# Patient Record
Sex: Female | Born: 1940 | Race: White | Hispanic: No | Marital: Married | Smoking: Never smoker
Health system: Southern US, Community
[De-identification: ages and names within clinical notes are randomized; demographics above are authoritative.]

## PROBLEM LIST (undated history)

## (undated) DIAGNOSIS — E039 Hypothyroidism, unspecified: Secondary | ICD-10-CM

## (undated) DIAGNOSIS — Z9889 Other specified postprocedural states: Secondary | ICD-10-CM

## (undated) DIAGNOSIS — G8929 Other chronic pain: Secondary | ICD-10-CM

## (undated) DIAGNOSIS — I10 Essential (primary) hypertension: Secondary | ICD-10-CM

## (undated) DIAGNOSIS — K589 Irritable bowel syndrome without diarrhea: Secondary | ICD-10-CM

## (undated) DIAGNOSIS — R9439 Abnormal result of other cardiovascular function study: Secondary | ICD-10-CM

## (undated) DIAGNOSIS — K219 Gastro-esophageal reflux disease without esophagitis: Secondary | ICD-10-CM

## (undated) DIAGNOSIS — F419 Anxiety disorder, unspecified: Secondary | ICD-10-CM

## (undated) DIAGNOSIS — G971 Other reaction to spinal and lumbar puncture: Secondary | ICD-10-CM

## (undated) DIAGNOSIS — M542 Cervicalgia: Secondary | ICD-10-CM

## (undated) DIAGNOSIS — R112 Nausea with vomiting, unspecified: Secondary | ICD-10-CM

## (undated) DIAGNOSIS — N390 Urinary tract infection, site not specified: Secondary | ICD-10-CM

## (undated) DIAGNOSIS — M1990 Unspecified osteoarthritis, unspecified site: Secondary | ICD-10-CM

## (undated) HISTORY — PX: APPENDECTOMY: SHX54

## (undated) HISTORY — PX: TONSILLECTOMY: SUR1361

## (undated) HISTORY — PX: RECTOCELE REPAIR: SHX761

## (undated) HISTORY — PX: OTHER SURGICAL HISTORY: SHX169

## (undated) HISTORY — PX: DILATION AND CURETTAGE OF UTERUS: SHX78

---

## 1983-07-11 HISTORY — PX: ABDOMINAL HYSTERECTOMY: SHX81

## 2001-07-12 ENCOUNTER — Ambulatory Visit (HOSPITAL_COMMUNITY): Admission: RE | Admit: 2001-07-12 | Discharge: 2001-07-12 | Payer: Self-pay | Admitting: Obstetrics and Gynecology

## 2001-07-12 ENCOUNTER — Encounter: Payer: Self-pay | Admitting: Obstetrics and Gynecology

## 2002-09-19 ENCOUNTER — Encounter: Payer: Self-pay | Admitting: Family Medicine

## 2002-09-19 ENCOUNTER — Ambulatory Visit (HOSPITAL_COMMUNITY): Admission: RE | Admit: 2002-09-19 | Discharge: 2002-09-19 | Payer: Self-pay | Admitting: Family Medicine

## 2003-03-09 ENCOUNTER — Encounter: Payer: Self-pay | Admitting: Internal Medicine

## 2003-03-09 ENCOUNTER — Ambulatory Visit (HOSPITAL_COMMUNITY): Admission: RE | Admit: 2003-03-09 | Discharge: 2003-03-09 | Payer: Self-pay | Admitting: Internal Medicine

## 2003-07-08 ENCOUNTER — Ambulatory Visit (HOSPITAL_COMMUNITY): Admission: RE | Admit: 2003-07-08 | Discharge: 2003-07-08 | Payer: Self-pay | Admitting: Internal Medicine

## 2003-09-29 ENCOUNTER — Ambulatory Visit (HOSPITAL_COMMUNITY): Admission: RE | Admit: 2003-09-29 | Discharge: 2003-09-29 | Payer: Self-pay | Admitting: Internal Medicine

## 2003-10-08 ENCOUNTER — Other Ambulatory Visit: Admission: RE | Admit: 2003-10-08 | Discharge: 2003-10-08 | Payer: Self-pay | Admitting: Dermatology

## 2003-12-25 ENCOUNTER — Ambulatory Visit (HOSPITAL_COMMUNITY): Admission: RE | Admit: 2003-12-25 | Discharge: 2003-12-25 | Payer: Self-pay | Admitting: Internal Medicine

## 2003-12-26 HISTORY — PX: COLONOSCOPY: SHX174

## 2004-03-04 ENCOUNTER — Ambulatory Visit (HOSPITAL_COMMUNITY): Admission: RE | Admit: 2004-03-04 | Discharge: 2004-03-04 | Payer: Self-pay | Admitting: Internal Medicine

## 2004-03-04 HISTORY — PX: COLONOSCOPY: SHX174

## 2004-08-15 ENCOUNTER — Ambulatory Visit (HOSPITAL_COMMUNITY): Admission: RE | Admit: 2004-08-15 | Discharge: 2004-08-15 | Payer: Self-pay | Admitting: Internal Medicine

## 2004-10-04 ENCOUNTER — Ambulatory Visit (HOSPITAL_COMMUNITY): Admission: RE | Admit: 2004-10-04 | Discharge: 2004-10-04 | Payer: Self-pay | Admitting: Obstetrics and Gynecology

## 2005-10-05 ENCOUNTER — Ambulatory Visit (HOSPITAL_COMMUNITY): Admission: RE | Admit: 2005-10-05 | Discharge: 2005-10-05 | Payer: Self-pay | Admitting: Internal Medicine

## 2005-11-29 ENCOUNTER — Ambulatory Visit: Payer: Self-pay | Admitting: Internal Medicine

## 2005-12-06 ENCOUNTER — Ambulatory Visit (HOSPITAL_COMMUNITY): Admission: RE | Admit: 2005-12-06 | Discharge: 2005-12-06 | Payer: Self-pay | Admitting: Internal Medicine

## 2006-10-08 ENCOUNTER — Ambulatory Visit (HOSPITAL_COMMUNITY): Admission: RE | Admit: 2006-10-08 | Discharge: 2006-10-08 | Payer: Self-pay | Admitting: Internal Medicine

## 2007-07-10 ENCOUNTER — Ambulatory Visit (HOSPITAL_COMMUNITY): Admission: RE | Admit: 2007-07-10 | Discharge: 2007-07-10 | Payer: Self-pay | Admitting: Internal Medicine

## 2007-07-11 DIAGNOSIS — R9439 Abnormal result of other cardiovascular function study: Secondary | ICD-10-CM

## 2007-07-11 HISTORY — DX: Abnormal result of other cardiovascular function study: R94.39

## 2007-07-11 HISTORY — PX: CHOLECYSTECTOMY: SHX55

## 2007-08-08 ENCOUNTER — Ambulatory Visit: Payer: Self-pay | Admitting: Internal Medicine

## 2007-08-16 ENCOUNTER — Ambulatory Visit (HOSPITAL_COMMUNITY): Admission: RE | Admit: 2007-08-16 | Discharge: 2007-08-16 | Payer: Self-pay | Admitting: Internal Medicine

## 2007-08-16 ENCOUNTER — Ambulatory Visit: Payer: Self-pay | Admitting: Internal Medicine

## 2007-08-16 HISTORY — PX: ESOPHAGOGASTRODUODENOSCOPY: SHX1529

## 2007-08-21 ENCOUNTER — Encounter (HOSPITAL_COMMUNITY): Admission: RE | Admit: 2007-08-21 | Discharge: 2007-09-20 | Payer: Self-pay | Admitting: Internal Medicine

## 2007-09-02 ENCOUNTER — Ambulatory Visit (HOSPITAL_COMMUNITY): Admission: RE | Admit: 2007-09-02 | Discharge: 2007-09-02 | Payer: Self-pay | Admitting: General Surgery

## 2007-09-02 ENCOUNTER — Encounter (INDEPENDENT_AMBULATORY_CARE_PROVIDER_SITE_OTHER): Payer: Self-pay | Admitting: General Surgery

## 2007-10-02 ENCOUNTER — Ambulatory Visit: Payer: Self-pay | Admitting: Internal Medicine

## 2007-10-30 ENCOUNTER — Ambulatory Visit (HOSPITAL_COMMUNITY): Admission: RE | Admit: 2007-10-30 | Discharge: 2007-10-30 | Payer: Self-pay | Admitting: Internal Medicine

## 2007-11-28 ENCOUNTER — Ambulatory Visit (HOSPITAL_COMMUNITY): Admission: RE | Admit: 2007-11-28 | Discharge: 2007-11-28 | Payer: Self-pay | Admitting: Internal Medicine

## 2008-09-21 ENCOUNTER — Ambulatory Visit (HOSPITAL_COMMUNITY): Admission: RE | Admit: 2008-09-21 | Discharge: 2008-09-21 | Payer: Self-pay | Admitting: Internal Medicine

## 2008-11-17 ENCOUNTER — Ambulatory Visit (HOSPITAL_COMMUNITY): Admission: RE | Admit: 2008-11-17 | Discharge: 2008-11-17 | Payer: Self-pay | Admitting: Internal Medicine

## 2009-11-22 ENCOUNTER — Ambulatory Visit (HOSPITAL_COMMUNITY): Admission: RE | Admit: 2009-11-22 | Discharge: 2009-11-22 | Payer: Self-pay | Admitting: Internal Medicine

## 2010-08-01 ENCOUNTER — Encounter: Payer: Self-pay | Admitting: Internal Medicine

## 2010-11-22 NOTE — Op Note (Signed)
NAMEAVANTI, Misty [name]            ACCOUNT NO.:  [id number]   MEDICAL RECORD NO.:  [id number]          PATIENT TYPE:  AMB   LOCATION:  DAY                           FACILITY:  [facility]   PHYSICIAN:  Dalia Heading, M.D.  DATE OF BIRTH:  [birth year]   DATE OF PROCEDURE:  09/02/2007  DATE OF DISCHARGE:                               OPERATIVE REPORT   PREOPERATIVE DIAGNOSIS:  Chronic cholecystitis.   POSTOPERATIVE DIAGNOSIS:  Chronic cholecystitis.   PROCEDURE:  Laparoscopic cholecystectomy.   SURGEON:  Dr. Franky Macho.   ANESTHESIA:  General endotracheal.   INDICATIONS:  The patient is a 70 year old white female who presents  with biliary colic secondary to chronic cholecystitis.  The risks and  benefits of the procedure including bleeding, infection, hepatobiliary  injury, the possibly an open procedure were fully explained to the  patient, who gave informed consent.   PROCEDURE NOTE:  The patient was placed in supine position.  After  induction of general endotracheal anesthesia, the abdomen was prepped  and draped in the usual sterile technique with Betadine.  Surgical site  confirmation was performed.   An infraumbilical incision was made down to fascia.  A Veress needle was  introduced into the abdominal cavity and confirmation of placement was  done using the saline drop test.  The abdomen was then insufflated to 16  mmHg pressure.  An 11 mm trocar was introduced into the abdominal cavity  under direct visualization.  There was some omentum adherent to the  underside of the abdominal wall.  Camera was then advanced into the  right upper quadrant under direct visualization without difficulty.  The  bowel was inspected and there was no evidence of injury to the bowel.  The patient was placed in reversed Trendelenburg position.  An  additional 11-mm trocar was placed in the epigastric region and 5-mm  trocars were placed in the right upper quadrant and right flank regions.  Liver was inspected and noted to be within normal limits.  The  gallbladder was retracted superior and laterally.  The dissection was  begun around the infundibulum of the gallbladder.  Cystic duct was first  identified.  Its juncture to the infundibulum fully identified.  Endo  clips were placed proximally and distally on the cystic duct and cystic  duct was divided.  This was likewise done to cystic artery.  The  gallbladder then freed away from the gallbladder fossa using Bovie  electrocautery.  The gallbladder delivered through the epigastric trocar  site using EndoCatch bag.  The gallbladder fossa was inspected and no  abnormal bleeding or bile leakage was noted.  Surgicel was placed in the  gallbladder fossa.  All fluid and air were then evacuated from the  abdominal cavity prior to removal of the trocars.   All wounds were irrigated normal saline.  All wounds were injected with  0.5 cm Sensorcaine.  The infraumbilical fascia was reapproximated using  a 0-0 Vicryl interrupted suture.  All skin incisions were closed using  staples.  Betadine ointment and dry sterile dressings were applied.   All tape and needle counts were  correct at the end of the procedure.  The patient was extubated in the operating room and went back to  recovery room awake and in stable condition.   COMPLICATIONS:  None.   SPECIMEN:  Gallbladder.   BLOOD LOSS:  Minimal.      Dalia Heading, M.D.  Electronically Signed     MAJ/MEDQ  D:  09/02/2007  T:  09/02/2007  Job:  [id number]   cc:   Kingsley Callander. Ouida Sills, MD  Fax: [phone]

## 2010-11-22 NOTE — H&P (Signed)
Misty [name], Misty [name]            ACCOUNT NO.:  [id number]   MEDICAL RECORD NO.:  [id number]          PATIENT TYPE:  AMB   LOCATION:  DAY                           FACILITY:  [facility]   PHYSICIAN:  Dalia Heading, M.D.  DATE OF BIRTH:  [birth year]   DATE OF ADMISSION:  09/02/2007  DATE OF DISCHARGE:  LH                              HISTORY & PHYSICAL   CHIEF COMPLAINT:  Chronic cholecystitis.   HISTORY OF PRESENT ILLNESS:  The patient is a 70 year old white female  who is referred for evaluation and treatment of biliary colic secondary  to chronic cholecystitis.  She has been having right upper quadrant  abdominal pain with radiation to the flank, nausea and indigestion for  many weeks.  It is made worse with fatty food.  No fever, chills or  jaundice have been noted.   PAST MEDICAL HISTORY:  1. Hypothyroidism.  2. Reflux disease.   PAST SURGICAL HISTORY:  Appendectomy, hysterectomy, tonsillectomy.   CURRENT MEDICATIONS:  Synthroid, Estrace, Protonix, lorazepam.   ALLERGIES:  ASPIRIN.   REVIEW OF SYSTEMS:  Noncontributory.   PHYSICAL EXAMINATION:  The patient is a well-developed, well-nourished  female in no acute distress.  HEENT:  No scleral icterus.  LUNGS:  Clear to auscultation with equal breath sounds bilaterally.  CARDIAC:  A regular rate and rhythm without S3, S4 or murmurs.  ABDOMEN:  Soft, nontender, nondistended.  No hepatosplenomegaly, masses  or hernias are identified.   Ultrasound of the gallbladder is negative.  HIDA scan reveals chronic  cholecystitis with a low gallbladder ejection fraction and reproducible  symptoms with a fatty meal.   IMPRESSION:  Chronic cholecystitis.   PLAN:  The patient is scheduled for a laparoscopic cholecystectomy on  September 02, 2007.  The risks and benefits of the procedure including  bleeding, infection, hepatobiliary injury, and the possibility of an  open procedure were fully explained to the patient, who gave informed  consent.      Dalia Heading, M.D.  Electronically Signed     MAJ/MEDQ  D:  08/29/2007  T:  08/30/2007  Job:  [id number]   cc:   [facility]  Fax: [phone]   Kingsley Callander. Ouida Sills, MD  Fax: [phone]

## 2010-11-22 NOTE — Procedures (Signed)
NAMEARACELI, Misty [name] NO.:  [id number]   MEDICAL RECORD NO.:  [id number]          PATIENT TYPE:  OUT   LOCATION:                                FACILITY:  [facility]   PHYSICIAN:  Kingsley Callander. Ouida Sills, MD       DATE OF BIRTH:  1941/05/06   DATE OF PROCEDURE:  DATE OF DISCHARGE:                                  STRESS TEST   EXERCISE STRESS TEST   The patient exercised 8 minutes (2 minutes in stage III of the Bruce  protocol) attaining a maximal heart rate of 166 (108% of the age  predicted maximal heart rate) at a workload of 10.1 METs and  discontinued exercise due to fatigue.  There were no symptoms of chest  pain.  There were 2 PVCs during recovery, but otherwise no rhythm  changes.  There was a brisk cardioaccelerator response consistent with  possible exercise deconditioning.  The baseline electrocardiogram  revealed normal sinus rhythm with nonspecific inferolateral ST-T wave  changes.  These changes were present throughout the study and in  recovery.  No significant change is noted.   IMPRESSION:  No definite evidence of exercise-induced ischemia.  Baseline inferolateral ST-T wave changes do somewhat limit  interpretation of the study.  This was discussed with the patient.  Based on her risk factor profile and lack of symptoms, we will not  proceed with a Myoview study at this time.      Kingsley Callander. Ouida Sills, MD  Electronically Signed     ROF/MEDQ  D:  11/28/2007  T:  11/28/2007  Job:  [id number]

## 2010-11-22 NOTE — Consult Note (Signed)
NAME:  Misty [name], Misty [name]            ACCOUNT NO.:  [id number]   MEDICAL RECORD NO.:  [id number]          PATIENT TYPE:  AMB   LOCATION:  DAY                           FACILITY:  [facility]   PHYSICIAN:  R. Roetta Sessions, M.D. DATE OF BIRTH:  December 14, 1940   DATE OF CONSULTATION:  08/08/2007  DATE OF DISCHARGE:                                 CONSULTATION   REFERRING PHYSICIAN:  Kingsley Callander. Ouida Sills, M.D.   CHIEF COMPLAINT/REASON FOR REFERRAL:  Refractory acid reflux.   HISTORY OF PRESENT ILLNESS:  Ms. Misty Wamser. [name] is a pleasant, 10-  year-old lady referred over to me by Dr. Carylon Perches to further evaluate a  60-month history of apparent recurrent reflux symptoms.  Ms. Misty [name]  describes intermittent postprandial boring epigastric pain that radiates  into her back.  She has occasional hot sour liquid coming up in to her  esophagus, but not always.  She had been on acid suppression therapy in  the past.  She contacted Dr. Ouida Sills in November or December 2008, and she  has been on Protonix, Nexium, and more recently Prevacid 30 grams orally  on a b.i.d. regimen, and all of her regurgitation/heartburn component of  her symptoms have improved.  She still has intermittent boring  epigastric pain which almost always occurs postprandially.   In addition, she was taking quite a bit of Motrin in the fall for  various aches and pain.  She has had not had any melena or rectal  bleeding, no odynophagia or dysphagia.  She has actually lost 2-1/2  pounds since when I last saw this nice lady in May 2007.  She had  previously undergone an EGD by Dr. Elder Cyphers in 2004.  By her report, it  indicated some reflux esophagitis.  There was no report of Barrett's  esophagus.  She had a colonoscopy done by me in 2005 which revealed  NSAID-related colonic ulceration, and follow-up colonoscopy demonstrated  complete healing of these lesions.  Ms. Misty [name] does not consume  alcohol.  She does not use tobacco products.  Although  I do not have a  report, Ms. Misty [name] states that Dr. Ouida Sills obtained a gallbladder  ultrasound which was reported as negative.   PAST MEDICAL HISTORY:  Noted for osteoarthritis and gastroesophageal  reflux disease.   PAST SURGERIES:  Two surgeries:  Appendectomy and hysterectomy.   CURRENT MEDICATIONS:  1. Synthroid 100 mcg daily alternating with 112 mcg every over day.  2. Estradiol half a 1 mg tablet daily.  3. Lorazepam 5 mg at bedtime p.r.n.  4. Ecotrin 81 mg twice weekly.  5. Prevacid 30 mg orally b.i.d.   ALLERGIES:  ASPIRIN.   FAMILY HISTORY:  Mother has reflux.  Father died with lung cancer.  One  sister with reflux.  No history of chronic GI or liver disease  otherwise.   SOCIAL HISTORY:  The patient is married, has 2 children. She is retired  from the [employer].  No tobacco, no alcohol.   REVIEW OF SYSTEMS:  No chest pain, dyspnea on exertion.  Minimal weight  loss in the past 2  years.  No fever, chills.   PHYSICAL EXAMINATION:  GENERAL:  A pleasant, 66-year lady resting  comfortably.  Weight 145, height 5 feet 3 inches.  VITAL SIGNS:  Temperature 97.8, BP 120/76, pulse 76.  SKIN:  Warm and dry.  HEENT EXAM:  No scleral icterus.  Conjunctivae are pink.  Oral cavity:  No lesions.  CHEST:  Lungs are clear to auscultation.  CARDIAC EXAM:  Regular rate and rhythm without murmur, gallop, or rub.  ABDOMEN:  Nondistended.  Positive bowel sounds.  She does have  epigastric tenderness to deep palpation.  No appreciable mass or  hepatosplenomegaly.  EXTREMITY EXAM:  No edema.   IMPRESSION:  Ms. Misty [name] is a very pleasant, 70 year old lady  with a good 3-4 month history of what she describes as postprandial  retroxiphoid pain radiating into her back.  She does have baseline  typical reflux symptoms which have been pretty much well squelched with  b.i.d. Prevacid;  however, the postprandial retroxiphoid pain that  radiates into  her back  intermittently has not really been significantly  improved with even an aggressive acid suppression regimen.  Even though  her gallbladder ultrasound was negative, I would be concerned about the  possibility of underlying occult gallbladder disease.  She is 3, and it  is bothersome that she is having a crescendo symptoms refractory to acid  suppression therapy.  She has been on Motrin recently as well.   RECOMMENDATIONS:  I told Ms. Misty [name] we ought to go ahead and just look  at her upper GI tract.  It has not been surveyed in a good 5 years.  EGD  is the best approach.  The risks, benefits, and alternatives have been  reviewed.  Her questions were answered.  She is agreeable.  If upper  endoscopy is unrevealing, I would then proceed with a HIDA with fatty  meal or CCK challenge.  Further recommendations to follow in the very  near future.   I would like to thank Dr. Carylon Perches for letting me see this nice lady  once again in consultation.      Jonathon Bellows, M.D.  Electronically Signed     RMR/MEDQ  D:  08/08/2007  T:  08/09/2007  Job:  [id number]   cc:   Kingsley Callander. Ouida Sills, MD  Fax: [phone]

## 2010-11-22 NOTE — Op Note (Signed)
NAME:  Misty [name], Misty [name]            ACCOUNT NO.:  [id number]   MEDICAL RECORD NO.:  [id number]          PATIENT TYPE:  AMB   LOCATION:  DAY                           FACILITY:  [facility]   PHYSICIAN:  R. Roetta Sessions, M.D. DATE OF BIRTH:  1940-08-20   DATE OF PROCEDURE:  08/16/2007  DATE OF DISCHARGE:                               OPERATIVE REPORT   PROCEDURE:  Diagnostic esophagogastroduodenoscopy.   INDICATIONS FOR PROCEDURE:  A 70 year old lady with a 75-month history of  postprandial retroxiphoid discomfort radiating into her back.  This year  her baseline typical reflux symptoms have been pretty well-squelched  with Prevacid once daily.  More recently she had been taking Prevacid 30  mg orally twice daily.  EGD now being done.  We saw her on August 08, 2007.  On her own she dropped back to Prevacid 30 mg once daily and  states that the above-mentioned symptoms have almost resolved.  Gallbladder ultrasound came back negative.  EGD is now being done.  This  approach has been discussed with the patient at length.  Potential  risks, benefits and alternatives have been reviewed, questions answered.  She is agreeable.  Please see the documentation in the medical record.   PROCEDURE NOTE:  O2 saturation, blood pressure, pulse and respirations  were monitored throughout entirety of the procedure.  Conscious  sedation:  Versed 4 mg IV, Demerol 100 mg IV in divided doses.  Instrument:  Pentax video chip system.   FINDINGS:  Examination of the tubular esophagus revealed no mucosal  abnormalities.  EG junction easily traversed.  Stomach:  Gastric cavity was empty and insufflated well with air.  A  thorough examination of the gastric mucosa including retroflex view of  the proximal stomach and esophagogastric junction demonstrated no  abnormalities.  Pylorus patent, easily traversed.  Examination of the  bulb and second portion revealed no abnormalities.   therapeutic/diagnostic maneuvers  performed:  None.   The patient tolerated the procedure well and was reacted in endoscopy.   IMPRESSION:  Normal esophagus, stomach, D1, D2.   1. Ms. Toutant is feeling much better backing off on the Prevacid, and      must note that she has also been taking Motrin recently as well.      At this point I hope that the transient symptom improvement      continues.  I have asked      her to continue Prevacid 30 mg orally daily, avoid Motrin.  2. Will hold up on further gallbladder evaluation for the time being.      We will plan to see this nice lady back in 6 weeks and see how she      is doing.      Jonathon Bellows, M.D.  Electronically Signed     RMR/MEDQ  D:  08/16/2007  T:  08/17/2007  Job:  [id number]   cc:   Kingsley Callander. Ouida Sills, MD  Fax: [phone]

## 2010-11-22 NOTE — Assessment & Plan Note (Signed)
NAMEMarland Kitchen  CANIYA, TAGLE             CHART#:  [id number]   DATE:  10/02/2007                       DOB:  [birth year]   CHIEF COMPLAINT:  Abdominal bloating and gas/constipation/reflux.   SUBJECTIVE:  Misty [name] is a 70 year old female.  She has a history of  gastroesophageal reflux disease and indigestion as her main complaint.  She had biliary dyskinesia.  She underwent a laparoscopic  cholecystectomy by Dr. Lovell Sheehan.  On September 02, 2007, she told me that  her epigastric pain has resolved.  She continues to have indigestion,  however, and gas and bloating.  She is on Protonix 3 mg daily.  She also  notes constipation.  She can go 2 to 3 days without a bowel movement.  She is having to take stool softeners.  She did awaken in the middle of  the night with cramps and urgency to defecate.  She occasionally has  urgency with loose stools but mostly deals with constipation.  She did  have a previous celiac antibody panel which was negative in [place],  [place].  She has been on Prevacid previously and failed.  She had an  EGD August 16, 2007, by Dr. Jena Gauss which was normal.  She had previously  had a colonoscopy by Dr. Jena Gauss on March 04, 2004.  This was a follow up  from a lesion found in June of this year felt to be an NSAID-induced  ulcer.  She was taking a significant amount of Alka-Seltzer at that  time.  Repeat colonoscopy showed a normal rectum showing left-sided  diverticula.  Previously-noted ulcerated area in the right colon was  gone.  It was recommended she have a colonoscopy in 10 years.  She had a  esophagram on Dec 06, 2005 which showed a small hiatal hernia without  obstruction of barium tablet, was otherwise normal.  She is having a  significant amount of bloating.  She tells me she is under a significant  amount of stress.  She is having insomnia as well.  She is having to  take Lorazepam about a quarter of a pill throughout the day.  Her  primary care physician is Dr.  Ouida Sills.  He tells me she is under a lot of  stress due to her mother having Alzheimer's and some volunteer work that  she is doing.  Her weight has remained stable.   CURRENT MEDICATIONS:  See the list from October 02, 2007.   ALLERGIES:  ASPIRIN, VICODIN CAUSES FACIAL EDEMA.   OBJECTIVE:  VITAL SIGNS:  Weight 143 pounds.  Height 63 inches,  temperature 97.9, blood pressure 112/82, pulse 80.  GENERAL:  Misty [name] is a well-developed, well-nourished Caucasian  female in no acute distress.  HEENT:  Pupils were clear, nonicteric.  Oropharynx pink and moist  without lesions.  NECK:  Supple with no masses or thyromegaly.  CHEST:  Regular rate and rhythm, normal S1, S2.  ABDOMEN:  Positive bowel sounds +4, no bruits auscultated, soft,  nontender, nondistended without  hepatosplenomegaly.  No rebound  tenderness or guarding.  EXTREMITIES:  Without edema.   ASSESSMENT:  Misty [name] is a 70 year old female who is status post  cholecystectomy for biliary dyskinesia.  She also has a history of  chronic gastroesophageal reflux disease and refractory indigestion  despite proton pump inhibitor.  She has significant complaints of  abdominal bloating and constipation.  She also notes she is under a  significant amount of stress.  There has been no change in her weight.  I suspect many of her symptoms could be attributed to  functional/irritable bowel syndrome.   PLAN:  1. Gas/Bloat literature as well as IBS literature given for her      review.  2. She is devoid Alka-Seltzers.  3. I have given her IBS digestive advantage to take daily.  4. I have suggested Fiber Choice 1 chewable daily.  5. MiraLax 17 g daily as needed for constipation.  6. She is to follow up with Dr. Ouida Sills regarding her depression and      anxiety for appropriate treatment.  7. Change Aciphex to 20 mg daily.  8. Discontinue Protonix 40 mg daily.       Lorenza Burton, N.P.  Electronically Signed     R. Roetta Sessions,  M.D.  Electronically Signed    KJ/MEDQ  D:  10/02/2007  T:  10/02/2007  Job:  [id number]   cc:   Kingsley Callander. Ouida Sills, MD

## 2010-11-25 ENCOUNTER — Other Ambulatory Visit (HOSPITAL_COMMUNITY): Payer: Self-pay | Admitting: Internal Medicine

## 2010-11-25 DIAGNOSIS — Z139 Encounter for screening, unspecified: Secondary | ICD-10-CM

## 2010-11-25 NOTE — Op Note (Signed)
NAME:  Misty [name], Misty [name]                      ACCOUNT NO.:  [id number]   MEDICAL RECORD NO.:  [id number]                   PATIENT TYPE:  AMB   LOCATION:  DAY                                  FACILITY:  [facility]   PHYSICIAN:  R. Roetta Sessions, M.D.              DATE OF BIRTH:  [birth year]   DATE OF PROCEDURE:  DATE OF DISCHARGE:                                 OPERATIVE REPORT   PROCEDURE:  Colonoscopy with biopsy.   ENDOSCOPIST:  Gerrit Friends. Rourk, M.D.   INDICATIONS FOR PROCEDURE:  The patient is a 70 year old lady referred out  of the courtesy of Dr. Carylon Perches for colorectal cancer screening.  Aside for  intermittent alternating constipation and diarrhea she is devoid of any  lower GI tract symptoms.  No family history of colorectal neoplasia.  She  never had her colon imaged previously.  Colonoscopy is now being done as a  screening maneuver.  This approach has been discussed with the patient at  length.  The potential risks, benefits, and alternatives have been reviewed;  questions answered  Please see my handwritten H&P.   PROCEDURE NOTE:  O2 saturation, blood pressure, pulse and respirations were  monitored throughout the entire procedure.  Conscious sedation: Demerol 100 mg, Versed 4 mg IV in divided doses.   INSTRUMENT:  Olympus video chip system.   FINDINGS:  Digital rectal exam revealed no abnormalities.   ENDOSCOPIC FINDINGS:  The prep was good.   RECTUM:  Examination of the rectal mucosa including the retroflex view of  the anal verge revealed no abnormalities.   COLON:  The colonic mucosa was surveyed from the rectosigmoid junction  through the left transverse and right colon to the area of the appendiceal  orifice, ileocecal valve, and cecum.  These structures were well seen and  photographed for the record.   From this level the scope was slowly withdrawn.  All previously mentioned  mucosal surfaces were again seen.  The patient was noted to have (1) shallow  left-sided diverticula; (2) A 1.5 to 2 cm sessile neoplastic appearing  process  adjacent to the ileocecal valve.  There was a large ulcerated  center.  Please see photos.  This likely represents neoplastic process.  I  do not feel that this lesion can be removed endoscopically. It was biopsied  multiple times. The remainder of the colonic mucosa appeared normal.  The  patient tolerated the procedure well and was reacted in endoscopy.   IMPRESSION:  1. Normal rectum.  2. 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  3. Shallow left-sided diverticula.  The remainder of the colonic mucosa     appeared normal.   RECOMMENDATIONS:  1. Follow up on path.  2. The patient has alternating constipation and diarrhea consistent with     irritable  bowel syndrome.  She ought to start taking a daily fiber     supplement in the way of Metamucil or Citrucel.  I told [name],     today, that she has a lesion in her right colon which will likely need to     be treated with surgical resection.  We will review the path as soon as     is available.      ___________________________________________                                            Jonathon Bellows, M.D.   RMR/MEDQ  D:  12/25/2003  T:  12/26/2003  Job:  [id number]   cc:   R. Roetta Sessions, M.D.  [address]  [place]  [place] [address]  Fax: [phone]   Kingsley Callander. Ouida Sills, M.D.  [address]  [place]  [place] [address]  Fax: [phone]

## 2010-11-25 NOTE — Op Note (Signed)
NAME:  Misty [name], Misty [name]                      ACCOUNT NO.:  [id number]   MEDICAL RECORD NO.:  [id number]                   PATIENT TYPE:  AMB   LOCATION:  DAY                                  FACILITY:  [facility]   PHYSICIAN:  R. Roetta Sessions, M.D.              DATE OF BIRTH:  09-09-1940   DATE OF PROCEDURE:  03/04/2004  DATE OF DISCHARGE:                                 OPERATIVE REPORT   PROCEDURE:  Colonoscopy.   INDICATIONS FOR PROCEDURE:  The patient is a 70 year old lady who underwent  screening colonoscopy back in June of this year.  She was found to have an  ulcerated area of mucosa adjacent to the ileocecal valve that was a  bothersome-appearing lesion.  Biopsies, however, revealed only chronic  colitis.  She had been taking aspirin in the way of Alka-Seltzer.  She has  refrained from taking all nonsteroidals.  We are now going back and looking  at the right colon once again.  This approach has been discussed with the  patient at length.  The potential risks, benefits, and alternatives have  been reviewed and questions answered.  She is agreeable.  Please see the  documentation in the medical record for more information.   PROCEDURE:  O2 saturation, blood pressure, pulses, and respirations were  monitored throughout the entirety of the procedure.  Conscious sedation was  with Versed 4 mg IV, Demerol 100 mg IV in divided doses.  The instrument  used was the Olympus video chip system.   FINDINGS:  Digital rectal examination revealed no abnormalities.   ENDOSCOPIC FINDINGS:  The prep was good.   Rectum:  Examination of the rectal mucosa including retroflex view of the  anal verge revealed no abnormalities.   Colon:  The colonic mucosa was surveyed from the rectosigmoid junction  through the left, transverse, right colon to the area of the appendiceal  orifice, ileocecal valve, and cecum.  These structures were well-seen and  photographed for the record.  From this level, the  scope was slowly  withdrawn.  All previously mentioned mucosal surfaces were again seen.  The  patient was again noted to have very shallow left-sided diverticula.  The  cecum and area of the ileocecal valve were closely inspected and well-seen.  The previously noted ulcerated area was not apparent.  The mucosa appeared  normal in the right colon.  It was well-seen.  The remainder of the colonic  mucosa was also normal.  The patient tolerated the procedure well and was  reactive in endoscopy.   IMPRESSION:  1. Normal rectum.  2. Shallow left-sided diverticula.  The remainder of the colonic mucosa     appeared normal.  3. Previously noted ulcerated area in the right colon was gone.  4. Aspirin and nonsteroidal anti-inflammatory drugs are well-known to     produce stricture, webs, and ulcerations throughout the upper     gastrointestinal tract, small bowel,  and even proximal colon.  Based on     the absence of the ulcer and cessation of nonsteroidal anti-inflammatory     drug therapy in the past two months, I would conclude that this was a     nonsteroidal anti-inflammatory drug induced lesion.   RECOMMENDATIONS:  1. Repeat colonoscopy in 10 years.  2. It would be best if Ms. Forlenza could avoid aspirin/NSAID products.  3. Follow up with Dr. Ouida Sills as needed.      ___________________________________________                                            Jonathon Bellows, M.D.   RMR/MEDQ  D:  03/04/2004  T:  03/04/2004  Job:  [id number]   cc:   Kingsley Callander. Ouida Sills, M.D.  [address]  [place]  [place] [address]  Fax: [phone]

## 2010-11-28 ENCOUNTER — Ambulatory Visit (HOSPITAL_COMMUNITY)
Admission: RE | Admit: 2010-11-28 | Discharge: 2010-11-28 | Disposition: A | Discharge status: Home / Self Care | Payer: Medicare Other | Source: Ambulatory Visit | Attending: Internal Medicine | Admitting: Internal Medicine

## 2010-11-28 ENCOUNTER — Ambulatory Visit (HOSPITAL_COMMUNITY): Payer: Self-pay

## 2010-11-28 DIAGNOSIS — Z139 Encounter for screening, unspecified: Secondary | ICD-10-CM

## 2010-11-28 DIAGNOSIS — Z1231 Encounter for screening mammogram for malignant neoplasm of breast: Secondary | ICD-10-CM | POA: Insufficient documentation

## 2010-12-14 ENCOUNTER — Other Ambulatory Visit (HOSPITAL_COMMUNITY): Payer: Self-pay | Admitting: Internal Medicine

## 2010-12-14 ENCOUNTER — Ambulatory Visit (HOSPITAL_COMMUNITY): Admission: RE | Admit: 2010-12-14 | Payer: Medicare Other | Source: Ambulatory Visit

## 2010-12-14 DIAGNOSIS — M1990 Unspecified osteoarthritis, unspecified site: Secondary | ICD-10-CM

## 2010-12-14 DIAGNOSIS — M542 Cervicalgia: Secondary | ICD-10-CM

## 2010-12-14 DIAGNOSIS — Z139 Encounter for screening, unspecified: Secondary | ICD-10-CM

## 2010-12-23 ENCOUNTER — Other Ambulatory Visit (HOSPITAL_COMMUNITY): Payer: Self-pay | Admitting: Internal Medicine

## 2010-12-23 ENCOUNTER — Ambulatory Visit (HOSPITAL_COMMUNITY)
Admission: RE | Admit: 2010-12-23 | Discharge: 2010-12-23 | Disposition: A | Discharge status: Home / Self Care | Payer: Medicare Other | Source: Ambulatory Visit | Attending: Internal Medicine | Admitting: Internal Medicine

## 2010-12-23 ENCOUNTER — Ambulatory Visit (HOSPITAL_COMMUNITY): Payer: Medicare Other

## 2010-12-23 DIAGNOSIS — M542 Cervicalgia: Secondary | ICD-10-CM

## 2010-12-23 DIAGNOSIS — Z139 Encounter for screening, unspecified: Secondary | ICD-10-CM

## 2010-12-23 DIAGNOSIS — M5030 Other cervical disc degeneration, unspecified cervical region: Secondary | ICD-10-CM | POA: Insufficient documentation

## 2010-12-23 DIAGNOSIS — M949 Disorder of cartilage, unspecified: Secondary | ICD-10-CM | POA: Insufficient documentation

## 2010-12-23 DIAGNOSIS — M899 Disorder of bone, unspecified: Secondary | ICD-10-CM | POA: Insufficient documentation

## 2010-12-23 DIAGNOSIS — M1990 Unspecified osteoarthritis, unspecified site: Secondary | ICD-10-CM

## 2010-12-23 DIAGNOSIS — Z780 Asymptomatic menopausal state: Secondary | ICD-10-CM | POA: Insufficient documentation

## 2011-02-08 ENCOUNTER — Ambulatory Visit (HOSPITAL_COMMUNITY)
Admission: RE | Admit: 2011-02-08 | Discharge: 2011-02-08 | Disposition: A | Discharge status: Home / Self Care | Payer: Medicare Other | Source: Ambulatory Visit | Attending: Internal Medicine | Admitting: Internal Medicine

## 2011-02-08 ENCOUNTER — Other Ambulatory Visit (HOSPITAL_COMMUNITY): Payer: Self-pay | Admitting: Internal Medicine

## 2011-02-08 DIAGNOSIS — R0602 Shortness of breath: Secondary | ICD-10-CM | POA: Insufficient documentation

## 2011-03-31 LAB — CBC
Hemoglobin: 14.1
MCHC: 35.4
MCV: 99.6
RBC: 3.98
WBC: 6

## 2011-03-31 LAB — BASIC METABOLIC PANEL
CO2: 31
Chloride: 100
Creatinine, Ser: 0.75
GFR calc Af Amer: >60
Potassium: 4.8
Sodium: 140

## 2011-03-31 LAB — HEPATIC FUNCTION PANEL
ALT: 26
Alkaline Phosphatase: 63
Indirect Bilirubin: 0.3
Total Bilirubin: 0.4
Total Protein: 6.9

## 2011-12-06 ENCOUNTER — Other Ambulatory Visit (HOSPITAL_COMMUNITY): Payer: Self-pay | Admitting: Internal Medicine

## 2011-12-06 DIAGNOSIS — Z139 Encounter for screening, unspecified: Secondary | ICD-10-CM

## 2011-12-11 ENCOUNTER — Ambulatory Visit (HOSPITAL_COMMUNITY)
Admission: RE | Admit: 2011-12-11 | Discharge: 2011-12-11 | Disposition: A | Discharge status: Home / Self Care | Payer: Medicare Other | Source: Ambulatory Visit | Attending: Internal Medicine | Admitting: Internal Medicine

## 2011-12-11 DIAGNOSIS — Z139 Encounter for screening, unspecified: Secondary | ICD-10-CM

## 2011-12-11 DIAGNOSIS — Z1231 Encounter for screening mammogram for malignant neoplasm of breast: Secondary | ICD-10-CM | POA: Insufficient documentation

## 2012-01-08 ENCOUNTER — Other Ambulatory Visit (HOSPITAL_COMMUNITY): Payer: Self-pay | Admitting: Internal Medicine

## 2012-01-08 ENCOUNTER — Ambulatory Visit (HOSPITAL_COMMUNITY)
Admission: RE | Admit: 2012-01-08 | Discharge: 2012-01-08 | Disposition: A | Discharge status: Home / Self Care | Payer: Medicare Other | Source: Ambulatory Visit | Attending: Internal Medicine | Admitting: Internal Medicine

## 2012-01-08 DIAGNOSIS — R0602 Shortness of breath: Secondary | ICD-10-CM | POA: Insufficient documentation

## 2012-01-09 ENCOUNTER — Other Ambulatory Visit (HOSPITAL_COMMUNITY): Payer: Self-pay | Admitting: Internal Medicine

## 2012-01-09 DIAGNOSIS — R42 Dizziness and giddiness: Secondary | ICD-10-CM

## 2012-01-10 ENCOUNTER — Ambulatory Visit (HOSPITAL_COMMUNITY)
Admission: RE | Admit: 2012-01-10 | Discharge: 2012-01-10 | Disposition: A | Discharge status: Home / Self Care | Payer: Medicare Other | Source: Ambulatory Visit | Attending: Internal Medicine | Admitting: Internal Medicine

## 2012-01-10 DIAGNOSIS — R42 Dizziness and giddiness: Secondary | ICD-10-CM | POA: Insufficient documentation

## 2012-03-21 ENCOUNTER — Ambulatory Visit (INDEPENDENT_AMBULATORY_CARE_PROVIDER_SITE_OTHER): Payer: Medicare Other | Admitting: Otolaryngology

## 2012-03-21 DIAGNOSIS — H9209 Otalgia, unspecified ear: Secondary | ICD-10-CM

## 2012-03-21 DIAGNOSIS — R42 Dizziness and giddiness: Secondary | ICD-10-CM

## 2012-08-06 ENCOUNTER — Encounter (HOSPITAL_COMMUNITY): Payer: Self-pay | Admitting: Pharmacy Technician

## 2012-08-07 ENCOUNTER — Other Ambulatory Visit (HOSPITAL_COMMUNITY): Payer: Medicare Other

## 2012-08-08 NOTE — Patient Instructions (Addendum)
Misty [name]  08/08/2012   Your procedure is scheduled on:  08/16/12  Report to [facility] at [facility] [facility].  Call this number if you have problems the morning of surgery: [phone]   Remember:   Do not eat food or drink liquids after midnight.   Take these medicines the morning of surgery with A SIP OF WATER: ativan, synthroid   Do not wear jewelry, make-up or nail polish.  Do not wear lotions, powders, or perfumes. You may wear deodorant.  Do not shave 48 hours prior to surgery. Men may shave face and neck.  Do not bring valuables to the hospital.  Contacts, dentures or bridgework may not be worn into surgery.  Leave suitcase in the car. After surgery it may be brought to your room.  For patients admitted to the hospital, checkout time is 11:00 AM the day of  discharge.   Patients discharged the day of surgery will not be allowed to drive  home.  Name and phone number of your driver: family  Special Instructions: Shower using CHG 2 nights before surgery and the night before surgery.  If you shower the day of surgery use CHG.  Use special wash - you have one bottle of CHG for all showers.  You should use approximately 1/3 of the bottle for each shower.   Please read over the following fact sheets that you were given: Pain Booklet, MRSA Information, Surgical Site Infection Prevention, Anesthesia Post-op Instructions and Care and Recovery After Surgery   PATIENT INSTRUCTIONS POST-ANESTHESIA  IMMEDIATELY FOLLOWING SURGERY:  Do not drive or operate machinery for the first twenty four hours after surgery.  Do not make any important decisions for twenty four hours after surgery or while taking narcotic pain medications or sedatives.  If you develop intractable nausea and vomiting or a severe headache please notify your doctor immediately.  FOLLOW-UP:  Please make an appointment with your surgeon as instructed. You do not need to follow up with anesthesia unless specifically instructed to do  so.  WOUND CARE INSTRUCTIONS (if applicable):  Keep a dry clean dressing on the anesthesia/puncture wound site if there is drainage.  Once the wound has quit draining you may leave it open to air.  Generally you should leave the bandage intact for twenty four hours unless there is drainage.  If the epidural site drains for more than 36-48 hours please call the anesthesia department.  QUESTIONS?:  Please feel free to call your physician or the hospital operator if you have any questions, and they will be happy to assist you.      Cystocele Repair A cystocele is a bulging, drooping hernia or break (rupture) of bladder tissue into the birth canal (vagina). This bulging or rupture occurs on the top front wall of the vagina. CAUSES  Cystocele is associated with weakness of the top front wall of the vagina due to stretching and tearing of the ligaments and muscles in the area. This is often the result of:  Multiple childbirths.  Continuous heavy lifting.  Chronic cough from asthma, emphysema, or smoking.  Being overweight.  Changes from aging.  Previous surgery in the vaginal area.  Menopause with loss of estrogen hormone and weakening of the ligaments and muscles around the bladder. SYMPTOMS   Uncontrolled loss of urine (incontinence) with cough, sneeze, or exercise.  Pelvic pressure.  Frequency or urgency to urinate because of inability to completely empty the bladder.  Bladder infections.  Needing to push on the  upper vagina to help yourself pass urine. DIAGNOSIS  A cystocele can be diagnosed by doing a pelvic exam and observing the top of the vagina drooping or bulging into or out of the vagina. TREATMENT  Surgical options:  Cystocele repair is surgery that removes the hernia.  There are also different "sling" operations that may be used. Discuss the different types of surgeries to repair a cystocele with your caregiver. Your caregiver will decide what type of surgery will  be best in your case. Nonsurgical options:  Kegel exercises. This helps strengthen and tighten the muscles and tissue in and around the bladder and vagina. This may help with mild cases of cystocele.  A pessary may help the cystocele. A pessary is a plastic or rubber device that lifts the bladder into place. A pessary must be fitted by a doctor.  Tampons or diaphragms that lift the bladder into place are sometimes helpful with a minor or small cystocele.  Estrogen may help with mild cases in menopausal and aging women. LET YOUR CAREGIVER KNOW ABOUT:   Allergies to food or medicine.  Medicines taken, including vitamins, herbs, eyedrops, over-the-counter medicines, and creams.  Use of steroids (by mouth or creams).  Previous problems with anesthetics or numbing medicines.  History of bleeding problems or blood clots.  Previous surgery.  Other health problems, including diabetes and kidney problems.  Possibility of pregnancy, if this applies. RISKS AND COMPLICATIONS  All surgery is associated with risks.  There are risks with a general anesthesia. You should discuss this with your caregiver.  With spinal or epidural anesthesia, there may be an area that is not numbed, and you could feel pain.  Headache could occur with a spinal or epidural anesthetic.  The catheter you will have after surgery may not work properly or may get blocked and need to be replaced.  Excessive bleeding.  Infection.  Injury to surrounding structures.  Recurrence of the cystocele.  Surgery may not get rid of your symptoms. BEFORE THE PROCEDURE   Do not take aspirin or blood thinners for 1 week prior to surgery, unless instructed otherwise.  Do not eat or drink anything after midnight the night before surgery.  Let your caregiver know if you develop a cold or other infectious problems prior to surgery.  If being admitted the day of surgery, you should be present 1 hour prior to your procedure  or as directed by your caregiver.  Plan and arrange for help when you go home from the hospital.  If you smoke, do not smoke for at least 2 weeks before the surgery.  Do not drink any alcohol for 3 days before the surgery. PROCEDURE  You will be given an anesthetic to prevent you from feeling pain during surgery. This may be a general anesthetic that puts you to sleep, or a spinal or epidural anesthetic. You will be asleep or be numbed through the entire procedure. During cystocele repair, tissue is pulled from the sides and around the top of the vagina to lift up the hernia. This removes the hernia so that the top of the vagina does not fall into the opening of the vagina. AFTER THE PROCEDURE  After surgery, you will be taken to the recovery room where a nurse will take care of you, checking your breathing, blood pressure, pulse, and your progress. When your caregiver feels you are stable, you will be taken to your room. You will have a drainage tube (Foley catheter) that will drain your  bladder for 2 to 7 days or longer, until your bladder is working properly. This catheter is placed prior to surgery to help keep your bladder empty and out of the way during the procedure. After surgery, this will make passing your urine easier. The catheter will be removed when you can easily pass urine without this assistance. You may have gauze packing in the vagina that will be removed 1 to 2 days after the surgery. Usually, you will be given a medicine (antibiotic) that kills germs. You will be given pain medicine as needed. You can usually go home in 3 to 5 days. HOME CARE INSTRUCTIONS   Do not take baths. Take showers until your caregiver informs you otherwise.  Take antibiotics as directed by your caregiver.  Exercise as instructed. Do not perform exercises which increase the pressure inside your belly (abdomen), such as sit-ups or lifting weights, until your caregiver has given permission. Walking exercise  is preferred.  Only take over-the-counter or prescription medicines for pain and discomfort as directed by your caregiver.  Do not drink alcohol while taking pain medicine.  Do not lift anything over 5 pounds.  Do not drive until your caregiver gives you permission.  Get plenty of rest and sleep.  Have someone help with your household chores for 1 to 2 weeks.  If you develop constipation, you may take a mild laxative with your caregiver's permission. Eating bran foods and drinking enough water and fluids to keep your urine clear or pale yellow helps with constipation.  Do not take aspirin. It may cause bleeding.  You may resume normal diet and unstrenuous activities as directed.  Do not douche, use tampons, or engage in intercourse until your surgeon has given permission.  Change bandages (dressings) as directed.  Make and keep all your postoperative appointments. SEEK MEDICAL CARE IF:   You have abnormal vaginal discharge.  You develop a rash.  You are having a reaction to your medicine.  You develop nausea or vomiting. SEEK IMMEDIATE MEDICAL CARE IF:   You have redness, swelling, or increasing pain in the vaginal area.  You notice pus coming from the vagina.  You have a fever.  You notice a bad smell coming from the vagina.  You have increasing abdominal pain.  You have frequent urination or you notice burning during urination.  You notice blood in your urine.  You have excessive vaginal bleeding.  You cannot urinate. MAKE SURE YOU:   Understand these instructions.  Will watch your condition.  Will get help right away if you are not doing well or get worse. Document Released: 06/23/2000 Document Revised: 09/18/2011 Document Reviewed: 09/23/2009 [facility] Patient Information 2013 [place], [place].

## 2012-08-09 ENCOUNTER — Other Ambulatory Visit: Payer: Self-pay

## 2012-08-09 ENCOUNTER — Encounter (HOSPITAL_COMMUNITY): Payer: Self-pay

## 2012-08-09 ENCOUNTER — Encounter (HOSPITAL_COMMUNITY)
Admission: RE | Admit: 2012-08-09 | Discharge: 2012-08-09 | Disposition: A | Discharge status: Home / Self Care | Payer: Medicare PPO | Source: Ambulatory Visit | Attending: Obstetrics and Gynecology | Admitting: Obstetrics and Gynecology

## 2012-08-09 ENCOUNTER — Other Ambulatory Visit: Payer: Self-pay | Admitting: Obstetrics and Gynecology

## 2012-08-09 HISTORY — DX: Other reaction to spinal and lumbar puncture: G97.1

## 2012-08-09 HISTORY — DX: Other specified postprocedural states: Z98.890

## 2012-08-09 HISTORY — DX: Hypothyroidism, unspecified: E03.9

## 2012-08-09 HISTORY — DX: Nausea with vomiting, unspecified: R11.2

## 2012-08-09 HISTORY — DX: Gastro-esophageal reflux disease without esophagitis: K21.9

## 2012-08-09 HISTORY — DX: Anxiety disorder, unspecified: F41.9

## 2012-08-09 HISTORY — DX: Unspecified osteoarthritis, unspecified site: M19.90

## 2012-08-09 LAB — COMPREHENSIVE METABOLIC PANEL
ALT: 19 U/L (ref 0–35)
AST: 25 U/L (ref 0–37)
Albumin: 3.9 g/dL (ref 3.5–5.2)
Alkaline Phosphatase: 69 U/L (ref 39–117)
Chloride: 102 mEq/L (ref 96–112)
Potassium: 4.5 mEq/L (ref 3.5–5.1)
Sodium: 139 mEq/L (ref 135–145)
Total Bilirubin: 0.3 mg/dL (ref 0.3–1.2)
Total Protein: 7.3 g/dL (ref 6.0–8.3)

## 2012-08-09 LAB — CBC
HCT: 40.8 % (ref 36.0–46.0)
MCHC: 34.6 g/dL (ref 30.0–36.0)
Platelets: 223 10*3/uL (ref 150–400)
RDW: 12 % (ref 11.5–15.5)
WBC: 7.4 10*3/uL (ref 4.0–10.5)

## 2012-08-09 LAB — URINALYSIS, ROUTINE W REFLEX MICROSCOPIC
Glucose, UA: NEGATIVE mg/dL
Hgb urine dipstick: NEGATIVE
Ketones, ur: NEGATIVE mg/dL
Leukocytes, UA: NEGATIVE
Protein, ur: NEGATIVE mg/dL
pH: 5.5 (ref 5.0–8.0)

## 2012-08-16 ENCOUNTER — Encounter (HOSPITAL_COMMUNITY): Payer: Self-pay | Admitting: Anesthesiology

## 2012-08-16 ENCOUNTER — Ambulatory Visit (HOSPITAL_COMMUNITY): Payer: Medicare PPO | Admitting: Anesthesiology

## 2012-08-16 ENCOUNTER — Encounter (HOSPITAL_COMMUNITY): Payer: Self-pay | Admitting: *Deleted

## 2012-08-16 ENCOUNTER — Encounter (HOSPITAL_COMMUNITY)
Admission: RE | Disposition: A | Discharge status: Home / Self Care | Payer: Self-pay | Source: Ambulatory Visit | Attending: Obstetrics and Gynecology

## 2012-08-16 ENCOUNTER — Encounter (HOSPITAL_COMMUNITY): Payer: Self-pay | Admitting: Obstetrics and Gynecology

## 2012-08-16 ENCOUNTER — Ambulatory Visit (HOSPITAL_COMMUNITY)
Admission: RE | Admit: 2012-08-16 | Discharge: 2012-08-16 | Disposition: A | Discharge status: Home / Self Care | Payer: Medicare PPO | Source: Ambulatory Visit | Attending: Obstetrics and Gynecology | Admitting: Obstetrics and Gynecology

## 2012-08-16 DIAGNOSIS — N8111 Cystocele, midline: Secondary | ICD-10-CM

## 2012-08-16 DIAGNOSIS — Z79890 Hormone replacement therapy (postmenopausal): Secondary | ICD-10-CM | POA: Insufficient documentation

## 2012-08-16 DIAGNOSIS — Z01810 Encounter for preprocedural cardiovascular examination: Secondary | ICD-10-CM | POA: Insufficient documentation

## 2012-08-16 DIAGNOSIS — Z79899 Other long term (current) drug therapy: Secondary | ICD-10-CM | POA: Insufficient documentation

## 2012-08-16 DIAGNOSIS — Z01812 Encounter for preprocedural laboratory examination: Secondary | ICD-10-CM | POA: Insufficient documentation

## 2012-08-16 DIAGNOSIS — I10 Essential (primary) hypertension: Secondary | ICD-10-CM | POA: Insufficient documentation

## 2012-08-16 HISTORY — PX: CYSTOCELE REPAIR: SHX163

## 2012-08-16 HISTORY — DX: Essential (primary) hypertension: I10

## 2012-08-16 SURGERY — COLPORRHAPHY, ANTERIOR, FOR CYSTOCELE REPAIR
Anesthesia: General | Site: Vagina | Wound class: Clean Contaminated

## 2012-08-16 MED ORDER — SODIUM CHLORIDE 0.9 % IR SOLN
Status: DC | PRN
  Administered 2012-08-16: 1000 mL

## 2012-08-16 MED ORDER — LACTATED RINGERS IV SOLN
INTRAVENOUS | Status: DC
  Administered 2012-08-16: 1000 mL via INTRAVENOUS

## 2012-08-16 MED ORDER — BUPIVACAINE-EPINEPHRINE PF 0.5-1:200000 % IJ SOLN
INTRAMUSCULAR | Status: AC
  Filled 2012-08-16: qty 10

## 2012-08-16 MED ORDER — LIDOCAINE HCL (PF) 1 % IJ SOLN
INTRAMUSCULAR | Status: AC
  Filled 2012-08-16: qty 5

## 2012-08-16 MED ORDER — ONDANSETRON HCL 4 MG/2ML IJ SOLN
INTRAMUSCULAR | Status: AC
  Filled 2012-08-16: qty 2

## 2012-08-16 MED ORDER — FENTANYL CITRATE 0.05 MG/ML IJ SOLN: 25.0000 ug | INTRAMUSCULAR | Status: DC | PRN

## 2012-08-16 MED ORDER — MIDAZOLAM HCL 2 MG/2ML IJ SOLN
INTRAMUSCULAR | Status: AC
  Filled 2012-08-16: qty 2

## 2012-08-16 MED ORDER — MIDAZOLAM HCL 2 MG/2ML IJ SOLN
1.0000 mg | INTRAMUSCULAR | Status: DC | PRN
  Administered 2012-08-16: 2 mg via INTRAVENOUS

## 2012-08-16 MED ORDER — LIDOCAINE HCL (CARDIAC) 10 MG/ML IV SOLN
INTRAVENOUS | Status: DC | PRN
  Administered 2012-08-16: 50 mg via INTRAVENOUS

## 2012-08-16 MED ORDER — FENTANYL CITRATE 0.05 MG/ML IJ SOLN
INTRAMUSCULAR | Status: AC
  Filled 2012-08-16: qty 5

## 2012-08-16 MED ORDER — POVIDONE-IODINE 10 % EX SOLN
CUTANEOUS | Status: DC | PRN
  Administered 2012-08-16: 1 via TOPICAL

## 2012-08-16 MED ORDER — ONDANSETRON HCL 4 MG/2ML IJ SOLN
4.0000 mg | Freq: Once | INTRAMUSCULAR | Status: AC | PRN
  Administered 2012-08-16: 4 mg via INTRAVENOUS

## 2012-08-16 MED ORDER — CEFAZOLIN SODIUM-DEXTROSE 2-3 GM-% IV SOLR
INTRAVENOUS | Status: AC
  Filled 2012-08-16: qty 50

## 2012-08-16 MED ORDER — DEXAMETHASONE SODIUM PHOSPHATE 4 MG/ML IJ SOLN
4.0000 mg | Freq: Once | INTRAMUSCULAR | Status: AC
  Administered 2012-08-16: 4 mg via INTRAVENOUS

## 2012-08-16 MED ORDER — SUCCINYLCHOLINE CHLORIDE 20 MG/ML IJ SOLN
INTRAMUSCULAR | Status: AC
  Filled 2012-08-16: qty 1

## 2012-08-16 MED ORDER — STERILE WATER FOR IRRIGATION IR SOLN
Status: DC | PRN
  Administered 2012-08-16: 1000 mL

## 2012-08-16 MED ORDER — FENTANYL CITRATE 0.05 MG/ML IJ SOLN
INTRAMUSCULAR | Status: DC | PRN
  Administered 2012-08-16 (×2): 100 ug via INTRAVENOUS

## 2012-08-16 MED ORDER — ONDANSETRON HCL 4 MG/2ML IJ SOLN
4.0000 mg | Freq: Once | INTRAMUSCULAR | Status: AC
  Administered 2012-08-16: 4 mg via INTRAVENOUS

## 2012-08-16 MED ORDER — DEXAMETHASONE SODIUM PHOSPHATE 4 MG/ML IJ SOLN
INTRAMUSCULAR | Status: AC
  Filled 2012-08-16: qty 1

## 2012-08-16 MED ORDER — LACTATED RINGERS IV SOLN
INTRAVENOUS | Status: DC | PRN
  Administered 2012-08-16 (×2): via INTRAVENOUS

## 2012-08-16 MED ORDER — ACETAMINOPHEN-CODEINE 300-60 MG PO TABS: 1.0000 | ORAL_TABLET | ORAL | Status: DC | PRN

## 2012-08-16 MED ORDER — PROPOFOL 10 MG/ML IV EMUL
INTRAVENOUS | Status: AC
  Filled 2012-08-16: qty 20

## 2012-08-16 MED ORDER — CEFAZOLIN SODIUM-DEXTROSE 2-3 GM-% IV SOLR: 2.0000 g | INTRAVENOUS | Status: DC

## 2012-08-16 MED ORDER — SUCCINYLCHOLINE CHLORIDE 20 MG/ML IJ SOLN
INTRAMUSCULAR | Status: DC | PRN
  Administered 2012-08-16: 100 mg via INTRAVENOUS

## 2012-08-16 MED ORDER — CEFAZOLIN SODIUM-DEXTROSE 2-3 GM-% IV SOLR
INTRAVENOUS | Status: DC | PRN
  Administered 2012-08-16: 2 g via INTRAVENOUS

## 2012-08-16 MED ORDER — PROPOFOL 10 MG/ML IV EMUL
INTRAVENOUS | Status: DC | PRN
  Administered 2012-08-16: 130 mg via INTRAVENOUS

## 2012-08-16 MED ORDER — BUPIVACAINE-EPINEPHRINE 0.5% -1:200000 IJ SOLN
INTRAMUSCULAR | Status: DC | PRN
  Administered 2012-08-16: 17 mL

## 2012-08-16 MED ORDER — NITROFURANTOIN MONOHYD MACRO 100 MG PO CAPS: 100.0000 mg | ORAL_CAPSULE | Freq: Two times a day (BID) | ORAL | Status: DC

## 2012-08-16 SURGICAL SUPPLY — 30 items
BAG HAMPER (MISCELLANEOUS) ×2 IMPLANT
CLOTH BEACON ORANGE TIMEOUT ST (SAFETY) ×2 IMPLANT
COVER LIGHT HANDLE STERIS (MISCELLANEOUS) ×4 IMPLANT
DECANTER SPIKE VIAL GLASS SM (MISCELLANEOUS) ×2 IMPLANT
DRAPE PROXIMA HALF (DRAPES) ×2 IMPLANT
DRAPE STERI URO 9X17 APER PCH (DRAPES) ×2 IMPLANT
ELECT REM PT RETURN 9FT ADLT (ELECTROSURGICAL) ×2
ELECTRODE REM PT RTRN 9FT ADLT (ELECTROSURGICAL) ×1 IMPLANT
FORMALIN 10 PREFIL 480ML (MISCELLANEOUS) ×2 IMPLANT
GAUZE PACKING 2X5 YD STERILE (GAUZE/BANDAGES/DRESSINGS) ×1 IMPLANT
GLOVE BIOGEL PI IND STRL 7.0 (GLOVE) IMPLANT
GLOVE BIOGEL PI INDICATOR 7.0 (GLOVE) ×2
GLOVE ECLIPSE 6.5 STRL STRAW (GLOVE) ×2 IMPLANT
GLOVE ECLIPSE 9.0 STRL (GLOVE) ×2 IMPLANT
GLOVE EXAM NITRILE MD LF STRL (GLOVE) ×1 IMPLANT
GLOVE INDICATOR STER SZ 9 (GLOVE) ×2 IMPLANT
GOWN STRL REIN 3XL LVL4 (GOWN DISPOSABLE) ×2 IMPLANT
GOWN STRL REIN XL XLG (GOWN DISPOSABLE) ×5 IMPLANT
KIT ROOM TURNOVER AP CYSTO (KITS) ×2 IMPLANT
MANIFOLD NEPTUNE II (INSTRUMENTS) ×2 IMPLANT
NDL HYPO 25X1 1.5 SAFETY (NEEDLE) IMPLANT
NEEDLE HYPO 25X1 1.5 SAFETY (NEEDLE) ×2 IMPLANT
NS IRRIG 1000ML POUR BTL (IV SOLUTION) ×2 IMPLANT
PACK PERI GYN (CUSTOM PROCEDURE TRAY) ×2 IMPLANT
PAD ARMBOARD 7.5X6 YLW CONV (MISCELLANEOUS) ×2 IMPLANT
SET BASIN LINEN APH (SET/KITS/TRAYS/PACK) ×2 IMPLANT
SUT CHROMIC 2 0 CT 1 (SUTURE) ×2 IMPLANT
SUT VIC AB 0 CT2 8-18 (SUTURE) ×2 IMPLANT
SYR CONTROL 10ML LL (SYRINGE) ×1 IMPLANT
TRAY FOLEY CATH 14FR (SET/KITS/TRAYS/PACK) ×2 IMPLANT

## 2012-08-16 NOTE — Op Note (Signed)
08/16/2012  8:56 AM  PATIENT:  Misty Misty [name]  72 y.o. female PRE-OPERATIVE DIAGNOSIS:  cystocele POST-OPERATIVE DIAGNOSIS:  cystocele  PROCEDURE:  Procedure(s) (LRB) with comments: ANTERIOR REPAIR (CYSTOCELE) (N/A) SURGEON:  Surgeon(s) and Role:    * Tilda Burrow, MD - Primary  PHYSICIAN ASSISTANT:  ASSISTANTS: [place] RN FA ANESTHESIA:   local and general  EBL:  Total I/O In: 1000 [I.V.:1000] Out: 25 [Blood:25] BLOOD ADMINISTERED:none  DRAINS: Urinary Catheter (Misty [name]) wit h vaginal pack , to be removed in RR  LOCAL MEDICATIONS USED:  MARCAINE    and Amount: 17 ml SPECIMEN:  No Specimen DISPOSITION OF SPECIMEN:  N/A  COUNTS:  YES  TOURNIQUET:  * No tourniquets in log *  DICTATION: .Dragon Dictation Patient was taken to the operating room prepped and draped in the high lithotomy leg support with timeout conducted, and procedure confirmed by surgical team. Ancef 2 g intravenously administered to the Misty [name] catheter was inserted, clamped with a hemostat to maintain bladder fullness for the dissection portion of the procedure, and midline inverted Y. incision made beneath the cystocele, with a cane 70 cc having been injected to fill a prior to the dissection. Metzenbaum sharp dissection was used to close the vaginal epithelium. Once sufficient lateral and superior dissection was completed series of 6 interrupted horizontal mattress sutures of 0 Vicryl were placed on the underside of the vaginal epithelium connecting tissues beneath the bladder resulting in elevation and lateral reduction of the cystocele. Redundant vaginal epithelium was trimmed. Continuous running 2-0 chromic was used on the midline portion of the incision and the Y. arms of the incision used interrupted 2-0 chromic was used to complete closure. Minimal bleeding was encountered. The bladder had been emptied prior to the actual closure of the cystocele and generous urine obtained patient however procedure well and  vaginal packing Early place which we will remove in recovery room, after which patient will be able to be discharged home when she can void. PLAN OF CARE: Discharge to home after PACU  PATIENT DISPOSITION:  PACU - hemodynamically stable.   Delay start of Pharmacological VTE agent (>24hrs) due to surgical blood loss or risk of bleeding: not applicable

## 2012-08-16 NOTE — Anesthesia Preprocedure Evaluation (Signed)
Anesthesia Evaluation  Patient identified by MRN, date of birth, ID band Patient awake    Reviewed: Allergy & Precautions, H&P , NPO status , Patient's Chart, lab work & pertinent test results  History of Anesthesia Complications (+) PONV and POST - OP SPINAL HEADACHE  Airway Mallampati: II TM Distance: <3 FB     Dental  (+) Edentulous Upper and Teeth Intact   Pulmonary neg pulmonary ROS,  breath sounds clear to auscultation        Cardiovascular hypertension, Pt. on medications Rhythm:Regular Rate:Normal     Neuro/Psych  Headaches,    GI/Hepatic GERD-  Medicated and Controlled,  Endo/Other  Hypothyroidism   Renal/GU      Musculoskeletal   Abdominal   Peds  Hematology   Anesthesia Other Findings   Reproductive/Obstetrics                           Anesthesia Physical Anesthesia Plan  ASA: II  Anesthesia Plan: General   Post-op Pain Management:    Induction: Intravenous, Rapid sequence and Cricoid pressure planned  Airway Management Planned: Oral ETT  Additional Equipment:   Intra-op Plan:   Post-operative Plan: Extubation in OR  Informed Consent: I have reviewed the patients History and Physical, chart, labs and discussed the procedure including the risks, benefits and alternatives for the proposed anesthesia with the patient or authorized representative who has indicated his/her understanding and acceptance.     Plan Discussed with:   Anesthesia Plan Comments:         Anesthesia Quick Evaluation

## 2012-08-16 NOTE — Transfer of Care (Signed)
  Anesthesia Post-op Note  Patient: Misty [name]  Procedure(s) Performed: Procedure(s) (LRB) with comments: ANTERIOR REPAIR (CYSTOCELE) (N/A)  Patient Location: PACU  Anesthesia Type: General  Level of Consciousness: awake, alert , oriented and patient cooperative  Airway and Oxygen Therapy: Patient Spontanous Breathing and Patient connected to face mask oxygen  Post-op Pain: mild  Post-op Assessment: Post-op Vital signs reviewed, Patient's Cardiovascular Status Stable, Respiratory Function Stable, Patent Airway and No signs of Nausea or vomiting  Post-op Vital Signs: Reviewed and stable  Complications: No apparent anesthesia complications

## 2012-08-16 NOTE — H&P (Signed)
Misty [name] is an 72 y.o. female. She is admitted at this time for cystocele repair due to symptomatic bulging noted at the introitus with the past month she denies stress incontinence or urinary urge incontinence. She does have a sensation of incomplete voiding except when she relaxes quite a bit. She has no significant nocturia. Bowel movements are not a problem if "diet is okay". She denies splinting to assist with defecation. Review of systems: On vaginal hormone therapy, and infrequently sexually active Pertinent Gynecological History: Menses: post-menopausal Bleeding:  Contraception: status post hysterectomy DES exposure: unknown Blood transfusions: none Sexually transmitted diseases: no past history Previous GYN Procedures: Hysterectomy, and [facility]. O[facility] [place] [facility] .19 80s, posterior repair  Last mammogram: normal Date:  Last pap: None requires, status post hysterectomy Date:  OB History: G 3, P 2012   Menstrual History: Menarche age:  No LMP recorded. Patient has had a hysterectomy.    Past Medical History  Diagnosis Date  . Hypothyroidism   . GERD (gastroesophageal reflux disease)   . Arthritis   . Anxiety   . PONV (postoperative nausea and vomiting)   . Spinal headache     pt. states when she had rectocele repair she had a spinal & had a bad headache after  . Hypertension     Past Surgical History  Procedure Date  . Abdominal hysterectomy 1985  . Cholecystectomy 2009    Dr. Lovell Sheehan  . Rectocele repair   . Tonsillectomy youth  . Appendectomy teenager  . Bunoinectomy     left foot  . Dilation and curettage of uterus     x 2    No family history on file.  Social History:  reports that she has never smoked. She does not have any smokeless tobacco history on file. She reports that she drinks alcohol. She reports that she does not use illicit drugs.  Allergies:  Allergies  Allergen Reactions  . Demerol (Meperidine)     nausea  .  Vicodin (Hydrocodone-Acetaminophen)     Swelling of face and turns skin orange    No prescriptions prior to admission    ROS  There were no vitals taken for this visit. Physical Exam Physical Examination: General appearance - alert, well appearing, and in no distress, oriented to person, place, and time and normal appearing weight Mental status - alert, oriented to person, place, and time, normal mood, behavior, speech, dress, motor activity, and thought processes Chest - clear to auscultation, no wheezes, rales or rhonchi, symmetric air entry Heart - normal rate and regular rhythm Abdomen - soft, nontender, nondistended, no masses or organomegaly Pelvic - VAGINA: normal appearing vagina with normal color and discharge, no lesions, PELVIC FLOOR EXAM: no cystocele, rectocele or prolapse noted, cystocele to intoitus, UTERUS: surgically absent, vaginal cuff well healed, ADNEXA: normal adnexa in size, nontender and no masses, no masses, RECTAL: normal rectal, no masses, no rectocele,  Extremities - peripheral pulses normal, no pedal edema, no clubbing or cyanosis Skin - normal coloration and turgor, no rashes, no suspicious skin lesions noted  No results found for this or any previous visit (from the past 24 hour(s)).  No results found.  Assessment/Plan: Cystocele, without urinary incontinence History of spinal headache Plan: Plan is for anterior repair position aware of the risks of procedure including injury to bladder or adjacent organs, gradual recurrence of cystocele, bleeding or infection. Plans are for outpatient procedure.  Misty [name] V 08/16/2012, 5:39 AM

## 2012-08-16 NOTE — Preoperative (Signed)
Beta Blockers   Reason not to administer Beta Blockers:Not Applicable 

## 2012-08-16 NOTE — Anesthesia Procedure Notes (Signed)
Procedure Name: Intubation Date/Time: 08/16/2012 7:45 AM Performed by: Carolyne Littles, AMY L Pre-anesthesia Checklist: Patient identified, Patient being monitored, Timeout performed, Emergency Drugs available and Suction available Patient Re-evaluated:Patient Re-evaluated prior to inductionOxygen Delivery Method: Circle System Utilized Preoxygenation: Pre-oxygenation with 100% oxygen Intubation Type: IV induction, Rapid sequence and Cricoid Pressure applied Laryngoscope Size: 3 and Miller Grade View: Grade I Tube type: Oral Tube size: 7.0 mm Number of attempts: 1 Airway Equipment and Method: stylet Placement Confirmation: ETT inserted through vocal cords under direct vision,  positive ETCO2 and breath sounds checked- equal and bilateral Secured at: 21 cm Tube secured with: Tape Dental Injury: Teeth and Oropharynx as per pre-operative assessment

## 2012-08-16 NOTE — Anesthesia Postprocedure Evaluation (Signed)
  Anesthesia Post-op Note  Patient: Misty [name]  Procedure(s) Performed: Procedure(s) (LRB) with comments: ANTERIOR REPAIR (CYSTOCELE) (N/A)  Patient Location: PACU  Anesthesia Type: General  Level of Consciousness: awake, alert , oriented and patient cooperative  Airway and Oxygen Therapy: Patient Spontanous Breathing and Patient connected to face mask oxygen  Post-op Pain: mild  Post-op Assessment: Post-op Vital signs reviewed, Patient's Cardiovascular Status Stable, Respiratory Function Stable, Patent Airway and No signs of Nausea or vomiting  Post-op Vital Signs: Reviewed and stable  Complications: No apparent anesthesia complications  

## 2012-08-19 ENCOUNTER — Encounter (HOSPITAL_COMMUNITY): Payer: Self-pay | Admitting: Obstetrics and Gynecology

## 2012-12-31 ENCOUNTER — Ambulatory Visit: Payer: Medicare PPO | Admitting: Gastroenterology

## 2013-01-06 ENCOUNTER — Encounter: Payer: Self-pay | Admitting: Internal Medicine

## 2013-01-06 ENCOUNTER — Ambulatory Visit (INDEPENDENT_AMBULATORY_CARE_PROVIDER_SITE_OTHER): Payer: Medicare PPO | Admitting: Gastroenterology

## 2013-01-06 VITALS — BP 134/71 | HR 79 | Temp 98.2°F | Ht 62.0 in | Wt 135.6 lb

## 2013-01-06 DIAGNOSIS — K219 Gastro-esophageal reflux disease without esophagitis: Secondary | ICD-10-CM

## 2013-01-06 NOTE — Progress Notes (Signed)
Primary Care Physician:  Carylon Perches, MD Primary Gastroenterologist:  Dr.  Jena Gauss    Chief Complaint  Patient presents with  . Gastrophageal Reflux    HPI:   Misty [name] is a 72 y.o. female presenting today as a self-referral secondary to GERD. She was last seen around 2009 and underwent an EGD at that time, which was normal. She will be due for screening colonoscopy in 2015.   Reports she is normally an anxious individual. Thyroid issues lately. Tried generic Synthroid, didn't work as well. Notes severe GERD. Onset a few weeks ago. Notes allergy exacerbations, sinus drainage, sore throat and hoarse in the morning. Notes significant burping, stomach is actually sore from burping. Was given Protonix by Dr. Ouida Sills about 2 weeks ago. Prior to this, she was not taking a PPI. Switched to Nexium on Saturday, which has helped some. Even water causes reflux. Taking Alka-Setzer with bad indigestion. Occasionally. Lately, she has taken "quite a bit". Taking Nexium only once daily. Occasional difficulty swallowing. Points to cervical area. Vague.  Notes reflux a bit better with Nexium but "still there". No odynophagia. No abdominal pain, nausea, or vomiting. Nexium causing more frequent stools. No weight loss, lack of appetite. Getting hungry causes more reflux. Eating a tiny snack helps with reflux. No melena. Takes [employer], which helps to soothe some. No rectal bleeding.   Past Medical History  Diagnosis Date  . Hypothyroidism   . GERD (gastroesophageal reflux disease)   . Arthritis   . Anxiety   . PONV (postoperative nausea and vomiting)   . Spinal headache     pt. states when she had rectocele repair she had a spinal & had a bad headache after  . Hypertension     Past Surgical History  Procedure Laterality Date  . Abdominal hysterectomy  1985  . Cholecystectomy  2009    Dr. Lovell Sheehan  . Rectocele repair    . Tonsillectomy  youth  . Appendectomy  teenager  . Bunoinectomy      left  foot  . Dilation and curettage of uterus      x 2  . Cystocele repair N/A 08/16/2012    Procedure: ANTERIOR REPAIR (CYSTOCELE);  Surgeon: Tilda Burrow, MD;  Location: [facility];  Service: Gynecology;  Laterality: N/A;  . Colonoscopy  12/26/2003    JYN:WGNFAO rectum/Sessile, infiltrating, ulcerated process adjacent to the ileocecal valve suspicious for colonic neoplasm, biopsied multiple times/Shallow left-sided diverticula  . Colonoscopy  03/04/2004    ZHY:QMVHQI rectum/Shallow left-sided diverticula/Previously noted ulcerated area in the right colon was gone/Aspirin and nonsteroidal anti-inflammatory drugs are well-known to produce stricture, webs, and ulcerations throughout the upper gastrointestinal tract, small bowel, and even proximal colon  . Esophagogastroduodenoscopy  08/16/2007    ONG:EXBMWU esophagus, stomach, D1, D2.    Current Outpatient Prescriptions  Medication Sig Dispense Refill  . bismuth subsalicylate (PEPTO BISMOL) 262 MG/15ML suspension Take 15 mLs by mouth every 6 (six) hours as needed for indigestion.      Marland Kitchen esomeprazole (NEXIUM) 40 MG capsule Take 40 mg by mouth daily before breakfast.      . estradiol (ESTRACE) 1 MG tablet Take 0.5 mg by mouth daily.      Marland Kitchen levothyroxine (SYNTHROID) 112 MCG tablet Take 112 mcg by mouth daily. Patient has to have Brand Name Drug      . LORazepam (ATIVAN) 0.5 MG tablet Take 0.25 mg by mouth at bedtime and may repeat dose one time if needed.  No current facility-administered medications for this visit.    Allergies as of 01/06/2013 - Review Complete 01/06/2013  Allergen Reaction Noted  . Demerol (meperidine)  08/06/2012  . Vicodin (hydrocodone-acetaminophen)  08/06/2012  . Tramadol  08/16/2012    Family History  Problem Relation Age of Onset  . Colon cancer Neg Hx     History   Social History  . Marital Status: Married    Spouse Name: N/A    Number of Children: N/A  . Years of Education: N/A   Occupational History   . Not on file.   Social History Main Topics  . Smoking status: Never Smoker   . Smokeless tobacco: Not on file  . Alcohol Use: Yes     Comment: social, rare wine  . Drug Use: No  . Sexually Active: Not on file   Other Topics Concern  . Not on file   Social History Narrative  . No narrative on file    Review of Systems: Gen: see HPI CV: +palpitations Resp: Denies shortness of breath at rest or with exertion. Denies wheezing or cough.  GI: see HPI GU : Denies urinary burning, urinary frequency, urinary hesitancy MS: +joint pain, arthritis Derm: Denies rash, itching, dry skin Psych: +anxiety, depression  Heme: Denies bruising, bleeding, and enlarged lymph nodes.  Physical Exam: BP 134/71  Pulse 79  Temp(Src) 98.2 F (36.8 C) (Oral)  Ht 5\' 2"  (1.575 m)  Wt 135 lb 9.6 oz (61.508 kg)  BMI 24.8 kg/m2 General:   Alert and oriented. Pleasant and cooperative. Well-nourished and well-developed.  Head:  Normocephalic and atraumatic. Eyes:  Without icterus, sclera clear and conjunctiva pink.  Ears:  Normal auditory acuity. Nose:  No deformity, discharge,  or lesions. Mouth:  No deformity or lesions, oral mucosa pink.  Neck:  Supple, without mass or thyromegaly. Lungs:  Clear to auscultation bilaterally. No wheezes, rales, or rhonchi. No distress.  Heart:  S1, S2 present without murmurs appreciated.  Abdomen:  +BS, soft, non-tender and non-distended. No HSM noted. No guarding or rebound. No masses appreciated.  Rectal:  Deferred  Msk:  Symmetrical without gross deformities. Normal posture. Extremities:  Without clubbing or edema. Neurologic:  Alert and  oriented x4;  grossly normal neurologically. Skin:  Intact without significant lesions or rashes. Cervical Nodes:  No significant cervical adenopathy. Psych:  Alert and cooperative. Normal mood and affect.

## 2013-01-06 NOTE — Patient Instructions (Addendum)
We have provided more samples of Nexium. Take this twice a day, 30 minutes before a meal.   Please call us in 10-14 days with an update. If you aren't doing better, we may need to do an upper endoscopy.  Review the reflux diet and try to follow closely. Again, please call if anything worsens.   Diet for Gastroesophageal Reflux Disease, Adult Reflux (acid reflux) is when acid from your stomach flows up into the esophagus. When acid comes in contact with the esophagus, the acid causes irritation and soreness (inflammation) in the esophagus. When reflux happens often or so severely that it causes damage to the esophagus, it is called gastroesophageal reflux disease (GERD). Nutrition therapy can help ease the discomfort of GERD. FOODS OR DRINKS TO AVOID OR LIMIT  Smoking or chewing tobacco. Nicotine is one of the most potent stimulants to acid production in the gastrointestinal tract.  Caffeinated and decaffeinated coffee and black tea.  Regular or low-calorie carbonated beverages or energy drinks (caffeine-free carbonated beverages are allowed).   Strong spices, such as black pepper, white pepper, red pepper, cayenne, curry powder, and chili powder.  Peppermint or spearmint.  Chocolate.  High-fat foods, including meats and fried foods. Extra added fats including oils, butter, salad dressings, and nuts. Limit these to less than 8 tsp per day.  Fruits and vegetables if they are not tolerated, such as citrus fruits or tomatoes.  Alcohol.  Any food that seems to aggravate your condition. If you have questions regarding your diet, call your caregiver or a registered dietitian. OTHER THINGS THAT MAY HELP GERD INCLUDE:   Eating your meals slowly, in a relaxed setting.  Eating 5 to 6 small meals per day instead of 3 large meals.  Eliminating food for a period of time if it causes distress.  Not lying down until 3 hours after eating a meal.  Keeping the head of your bed raised 6 to 9  inches (15 to 23 cm) by using a foam wedge or blocks under the legs of the bed. Lying flat may make symptoms worse.  Being physically active. Weight loss may be helpful in reducing reflux in overweight or obese adults.  Wear loose fitting clothing EXAMPLE MEAL PLAN This meal plan is approximately 2,000 calories based on https://www.bernard.org/ meal planning guidelines. Breakfast   cup cooked oatmeal.  1 cup strawberries.  1 cup low-fat milk.  1 oz almonds. Snack  1 cup cucumber slices.  6 oz yogurt (made from low-fat or fat-free milk). Lunch  2 slice whole-wheat bread.  2 oz sliced [place].  2 tsp mayonnaise.  1 cup blueberries.  1 cup snap peas. Snack  6 whole-wheat crackers.  1 oz string cheese. Dinner   cup brown rice.  1 cup mixed veggies.  1 tsp olive oil.  3 oz grilled fish. Document Released: 06/26/2005 Document Revised: 09/18/2011 Document Reviewed: 05/12/2011 [facility] Patient Information 2014 [place], [place].

## 2013-01-08 DIAGNOSIS — K219 Gastro-esophageal reflux disease without esophagitis: Secondary | ICD-10-CM | POA: Insufficient documentation

## 2013-01-08 NOTE — Assessment & Plan Note (Signed)
72 year old female, who readily admits she is an anxious individual, presents with GERD exacerbation and some improvement with reinstitution of a PPI. Associated belching and vague cervical dysphagia. Last EGD in 2009 completely normal. Prior to this, no PPI. Discussed strict GERD diet, AVOIDING Alka-Seltzer, and increasing Nexium to BID for a short course. I have discussed the possibility of an upper endoscopy +/- dilation as appropriate if no improvement with supportive measures. Her reports of dysphagia are vague, and I question uncontrolled GERD as the culprit. She is to call in 10-14 days with a progress report. If no improvement or worsening, proceed with EGD with Dr. Jena Gauss. I discussed the risks and benefits at time of appointment in the event this would be necessary.   As of note, screening colonoscopy due in 2015.

## 2013-01-08 NOTE — Progress Notes (Signed)
CC PCP 

## 2013-01-15 ENCOUNTER — Telehealth: Payer: Self-pay

## 2013-01-15 NOTE — Telephone Encounter (Signed)
Pt called with a progress report- she stated she is taking her nexium 1 hour prior to breakfast and taking her thyroid medication 1 hour after breakfast and taking Nexium 1 hour prior to supper, this seems to be working well for her. She still has some problems swallowing at times but it is not as bad as it was before.

## 2013-01-15 NOTE — Telephone Encounter (Signed)
Continue Nexium BID. I would like to see her again in 6 weeks or so.  Needs EGD if no significant improvement. Glad to hear she is doing better.  Please have her call if any issues between now and when we see her again.

## 2013-01-15 NOTE — Telephone Encounter (Signed)
Pt is scheduled for 02/26/13 with [facility] and is aware of recommendations.

## 2013-01-21 ENCOUNTER — Telehealth: Payer: Self-pay | Admitting: Gastroenterology

## 2013-01-21 ENCOUNTER — Ambulatory Visit: Payer: Medicare PPO | Admitting: Gastroenterology

## 2013-01-21 NOTE — Telephone Encounter (Signed)
Patient is stating the reflux is some better, c/o of her stomach is sore and she has diarrhea, could this be coming from the Nexium? Please advise?

## 2013-01-22 NOTE — Telephone Encounter (Signed)
Spoke with pt- she is feeling better. She is only taking the nexium once a day, she does have some problems swallowing at times but she stated it wasn't anything she was concerned about right now. She is not getting choked on anything and is able to keep fluids down. She wants to wait until her appt with AS in August.

## 2013-01-23 NOTE — Telephone Encounter (Signed)
Agree 

## 2013-01-31 ENCOUNTER — Other Ambulatory Visit (HOSPITAL_COMMUNITY): Payer: Self-pay | Admitting: Internal Medicine

## 2013-01-31 DIAGNOSIS — Z139 Encounter for screening, unspecified: Secondary | ICD-10-CM

## 2013-02-03 ENCOUNTER — Ambulatory Visit (HOSPITAL_COMMUNITY)
Admission: RE | Admit: 2013-02-03 | Discharge: 2013-02-03 | Disposition: A | Discharge status: Home / Self Care | Payer: Medicare PPO | Source: Ambulatory Visit | Attending: Internal Medicine | Admitting: Internal Medicine

## 2013-02-03 DIAGNOSIS — Z1231 Encounter for screening mammogram for malignant neoplasm of breast: Secondary | ICD-10-CM | POA: Insufficient documentation

## 2013-02-03 DIAGNOSIS — Z139 Encounter for screening, unspecified: Secondary | ICD-10-CM

## 2013-02-12 ENCOUNTER — Other Ambulatory Visit: Payer: Self-pay

## 2013-02-26 ENCOUNTER — Ambulatory Visit: Payer: Medicare PPO | Admitting: Gastroenterology

## 2013-03-26 ENCOUNTER — Ambulatory Visit: Payer: Medicare PPO | Admitting: Gastroenterology

## 2013-05-15 ENCOUNTER — Other Ambulatory Visit: Payer: Self-pay

## 2013-07-11 NOTE — Progress Notes (Signed)
REVIEWED.  

## 2013-08-16 ENCOUNTER — Encounter (HOSPITAL_COMMUNITY): Payer: Self-pay | Admitting: Emergency Medicine

## 2013-08-16 ENCOUNTER — Observation Stay (HOSPITAL_COMMUNITY)
Admission: EM | Admit: 2013-08-16 | Discharge: 2013-08-17 | Disposition: A | Discharge status: Home / Self Care | Payer: Medicare PPO | Attending: Internal Medicine | Admitting: Internal Medicine

## 2013-08-16 ENCOUNTER — Emergency Department (HOSPITAL_COMMUNITY): Payer: Medicare PPO

## 2013-08-16 DIAGNOSIS — F411 Generalized anxiety disorder: Secondary | ICD-10-CM | POA: Insufficient documentation

## 2013-08-16 DIAGNOSIS — G8929 Other chronic pain: Secondary | ICD-10-CM | POA: Insufficient documentation

## 2013-08-16 DIAGNOSIS — K219 Gastro-esophageal reflux disease without esophagitis: Secondary | ICD-10-CM | POA: Insufficient documentation

## 2013-08-16 DIAGNOSIS — E039 Hypothyroidism, unspecified: Secondary | ICD-10-CM | POA: Insufficient documentation

## 2013-08-16 DIAGNOSIS — M1990 Unspecified osteoarthritis, unspecified site: Secondary | ICD-10-CM | POA: Insufficient documentation

## 2013-08-16 DIAGNOSIS — R079 Chest pain, unspecified: Principal | ICD-10-CM | POA: Diagnosis present

## 2013-08-16 DIAGNOSIS — Z7982 Long term (current) use of aspirin: Secondary | ICD-10-CM | POA: Insufficient documentation

## 2013-08-16 DIAGNOSIS — K589 Irritable bowel syndrome without diarrhea: Secondary | ICD-10-CM | POA: Insufficient documentation

## 2013-08-16 DIAGNOSIS — M542 Cervicalgia: Secondary | ICD-10-CM | POA: Insufficient documentation

## 2013-08-16 HISTORY — DX: Other chronic pain: G89.29

## 2013-08-16 HISTORY — DX: Cervicalgia: M54.2

## 2013-08-16 HISTORY — DX: Irritable bowel syndrome without diarrhea: K58.9

## 2013-08-16 HISTORY — DX: Abnormal result of other cardiovascular function study: R94.39

## 2013-08-16 LAB — CBC WITH DIFFERENTIAL/PLATELET
Basophils Absolute: 0 10*3/uL (ref 0.0–0.1)
Basophils Relative: 0 % (ref 0–1)
EOS ABS: 0.2 10*3/uL (ref 0.0–0.7)
EOS PCT: 3 % (ref 0–5)
HCT: 41.7 % (ref 36.0–46.0)
Hemoglobin: 14.6 g/dL (ref 12.0–15.0)
LYMPHS ABS: 2.4 10*3/uL (ref 0.7–4.0)
Lymphocytes Relative: 39 % (ref 12–46)
MCH: 34.8 pg — ABNORMAL HIGH (ref 26.0–34.0)
MCHC: 35 g/dL (ref 30.0–36.0)
MCV: 99.5 fL (ref 78.0–100.0)
Monocytes Absolute: 0.6 10*3/uL (ref 0.1–1.0)
Monocytes Relative: 9 % (ref 3–12)
Neutro Abs: 3 10*3/uL (ref 1.7–7.7)
Neutrophils Relative %: 49 % (ref 43–77)
Platelets: 210 10*3/uL (ref 150–400)
RBC: 4.19 MIL/uL (ref 3.87–5.11)
RDW: 11.8 % (ref 11.5–15.5)
WBC: 6.2 10*3/uL (ref 4.0–10.5)

## 2013-08-16 LAB — COMPREHENSIVE METABOLIC PANEL
ALT: 21 U/L (ref 0–35)
AST: 28 U/L (ref 0–37)
Albumin: 3.9 g/dL (ref 3.5–5.2)
Alkaline Phosphatase: 69 U/L (ref 39–117)
BUN: 17 mg/dL (ref 6–23)
CO2: 29 mEq/L (ref 19–32)
Calcium: 9.8 mg/dL (ref 8.4–10.5)
Chloride: 98 mEq/L (ref 96–112)
Creatinine, Ser: 0.75 mg/dL (ref 0.50–1.10)
GFR calc Af Amer: >90 mL/min (ref >90)
GFR calc non Af Amer: 83 mL/min — ABNORMAL LOW (ref >90)
GLUCOSE: 95 mg/dL (ref 70–99)
Potassium: 3.5 mEq/L — ABNORMAL LOW (ref 3.7–5.3)
Sodium: 138 mEq/L (ref 137–147)
TOTAL PROTEIN: 7.6 g/dL (ref 6.0–8.3)
Total Bilirubin: 0.4 mg/dL (ref 0.3–1.2)

## 2013-08-16 LAB — POCT I-STAT TROPONIN I: TROPONIN I, POC: 0 ng/mL (ref 0.00–0.08)

## 2013-08-16 LAB — LIPASE, BLOOD: LIPASE: 31 U/L (ref 11–59)

## 2013-08-16 LAB — TROPONIN I: Troponin I: <0.3 ng/mL (ref <0.30)

## 2013-08-16 MED ORDER — ASPIRIN EC 81 MG PO TBEC
81.0000 mg | DELAYED_RELEASE_TABLET | Freq: Every day | ORAL | Status: DC
  Administered 2013-08-17: 81 mg via ORAL
  Filled 2013-08-16: qty 1

## 2013-08-16 MED ORDER — LEVOTHYROXINE SODIUM 112 MCG PO TABS
112.0000 ug | ORAL_TABLET | Freq: Every day | ORAL | Status: DC
  Administered 2013-08-16 – 2013-08-17 (×2): 112 ug via ORAL
  Filled 2013-08-16 (×3): qty 1

## 2013-08-16 MED ORDER — LORAZEPAM 0.5 MG PO TABS
0.2500 mg | ORAL_TABLET | Freq: Every day | ORAL | Status: DC
  Administered 2013-08-16: 0.25 mg via ORAL
  Filled 2013-08-16: qty 1

## 2013-08-16 MED ORDER — SODIUM CHLORIDE 0.9 % IV SOLN
INTRAVENOUS | Status: AC
Start: 2013-08-16 — End: 2013-08-17
  Administered 2013-08-16: 15:00:00 via INTRAVENOUS

## 2013-08-16 MED ORDER — ENOXAPARIN SODIUM 40 MG/0.4ML ~~LOC~~ SOLN
40.0000 mg | Freq: Every day | SUBCUTANEOUS | Status: DC
  Administered 2013-08-16 – 2013-08-17 (×2): 40 mg via SUBCUTANEOUS
  Filled 2013-08-16 (×2): qty 0.4

## 2013-08-16 NOTE — Progress Notes (Signed)
ANTICOAGULATION CONSULT NOTE - Initial Consult  Pharmacy Consult for Lovenox Indication: VTE prophylaxis  Allergies  Allergen Reactions  . Asa [Aspirin] Swelling    Does take coated aspirin  . Demerol [Meperidine] Nausea And Vomiting  . Vicodin [Hydrocodone-Acetaminophen] Swelling    Swelling of face and turns skin orange  . Tramadol Nausea And Vomiting    Patient Measurements: Weight: 135 lb (61.236 kg)  Vital Signs: Temp: 97.6 F (36.4 C) (02/07 0735) Temp src: Oral (02/07 0735) BP: 115/63 mmHg (02/07 1304) Pulse Rate: 88 (02/07 1304)  Labs:  Recent Labs  08/16/13 0740  HGB 14.6  HCT 41.7  PLT 210  CREATININE 0.75    The CrCl is unknown because both a height and weight (above a minimum accepted value) are required for this calculation.   Medical History: Past Medical History  Diagnosis Date  . Hypothyroidism   . GERD (gastroesophageal reflux disease)     refractory   . Arthritis   . Anxiety   . PONV (postoperative nausea and vomiting)   . Spinal headache     pt. states when she had rectocele repair she had a spinal & had a bad headache after  . Hypertension   . Chronic neck pain   . IBS (irritable bowel syndrome)   . Equivocal stress test 2009    NS STTW changes    Medications:  Scheduled:  . sodium chloride   Intravenous STAT  . aspirin EC  81 mg Oral Daily  . levothyroxine  112 mcg Oral QAC breakfast  . LORazepam  0.25 mg Oral QHS    Assessment: 73 yo F being admitted to start on Lovenox for VTE px.  She has good renal function.  CBC reviewed.  No bleeding noted.   Goal of Therapy:  Monitor platelets by anticoagulation protocol: Yes   Plan:  Lovenox 40mg  sq daily Pharmacy to sign off.  Please re-consult as needed  Elson ClanLilliston, Dinita Migliaccio Michelle 08/16/2013,2:37 PM

## 2013-08-16 NOTE — ED Provider Notes (Signed)
CSN: [id number]     Arrival date & time 08/16/13  0725 History   First MD Initiated Contact with Patient 08/16/13 0730     Chief Complaint  Patient presents with  . Chest Pain    HPI Pt was seen at 0745. Per pt, c/o gradual onset and persistence of waxing and waning lower chest "pain" that began last night approx 2000. Pt describes the pain as "aching," and radiating around to her left chest and back. Has been associated with nausea. States the pain improved and she was able to go to sleep, but she woke up approx 0500 with return of her symptoms. States she took an ASA and Catering managerAlka Seltzer with slow improvement. Endorses hx of GERD, but describes these symptoms as "different than that." Denies vomiting/diarrhea, no SOB/cough, no abd pain, no fevers, no palpitations.    Past Medical History  Diagnosis Date  . Hypothyroidism   . GERD (gastroesophageal reflux disease)     refractory   . Arthritis   . Anxiety   . PONV (postoperative nausea and vomiting)   . Spinal headache     pt. states when she had rectocele repair she had a spinal & had a bad headache after  . Hypertension   . Chronic neck pain   . IBS (irritable bowel syndrome)   . Equivocal stress test 2009    NS STTW changes   Past Surgical History  Procedure Laterality Date  . Abdominal hysterectomy  1985  . Cholecystectomy  2009    Dr. Lovell SheehanJenkins  . Rectocele repair    . Tonsillectomy  youth  . Appendectomy  teenager  . Bunoinectomy      left foot  . Dilation and curettage of uterus      x 2  . Cystocele repair N/A 08/16/2012    Procedure: ANTERIOR REPAIR (CYSTOCELE);  Surgeon: Tilda BurrowJohn V Ferguson, MD;  Location: [facility];  Service: Gynecology;  Laterality: N/A;  . Colonoscopy  12/26/2003    YQM:VHQIONRMR:Normal rectum/Sessile, infiltrating, ulcerated process adjacent to the ileocecal valve suspicious for colonic neoplasm, biopsied multiple times/Shallow left-sided diverticula  . Colonoscopy  03/04/2004    GEX:BMWUXLRMR:Normal rectum/Shallow left-sided  diverticula/Previously noted ulcerated area in the right colon was gone/Aspirin and nonsteroidal anti-inflammatory drugs are well-known to produce stricture, webs, and ulcerations throughout the upper gastrointestinal tract, small bowel, and even proximal colon  . Esophagogastroduodenoscopy  08/16/2007    KGM:WNUUVORMR:Normal esophagus, stomach, D1, D2.   Family History  Problem Relation Age of Onset  . Colon cancer Neg Hx    History  Substance Use Topics  . Smoking status: Never Smoker   . Smokeless tobacco: Not on file  . Alcohol Use: Yes     Comment: social, rare wine    Review of Systems ROS: Statement: All systems negative except as marked or noted in the HPI; Constitutional: Negative for fever and chills. ; ; Eyes: Negative for eye pain, redness and discharge. ; ; ENMT: Negative for ear pain, hoarseness, nasal congestion, sinus pressure and sore throat. ; ; Cardiovascular: +CP. Negative for palpitations, diaphoresis, dyspnea and peripheral edema. ; ; Respiratory: Negative for cough, wheezing and stridor. ; ; Gastrointestinal: +nausea. Negative for vomiting, diarrhea, abdominal pain, blood in stool, hematemesis, jaundice and rectal bleeding. . ; ; Genitourinary: Negative for dysuria, flank pain and hematuria. ; ; Musculoskeletal: Negative for back pain and neck pain. Negative for swelling and trauma.; ; Skin: Negative for pruritus, rash, abrasions, blisters, bruising and skin lesion.; ; Neuro: Negative for  headache, lightheadedness and neck stiffness. Negative for weakness, altered level of consciousness , altered mental status, extremity weakness, paresthesias, involuntary movement, seizure and syncope.      Allergies  Demerol; Vicodin; and Tramadol  Home Medications   Current Outpatient Rx  Name  Route  Sig  Dispense  Refill  . bismuth subsalicylate (PEPTO BISMOL) 262 MG/15ML suspension   Oral   Take 15 mLs by mouth every 6 (six) hours as needed for indigestion.         Marland Kitchen esomeprazole  (NEXIUM) 40 MG capsule   Oral   Take 40 mg by mouth daily before breakfast.         . estradiol (ESTRACE) 1 MG tablet   Oral   Take 0.5 mg by mouth daily.         Marland Kitchen levothyroxine (SYNTHROID) 112 MCG tablet   Oral   Take 112 mcg by mouth daily. Patient has to have Brand Name Drug         . LORazepam (ATIVAN) 0.5 MG tablet   Oral   Take 0.25 mg by mouth at bedtime and may repeat dose one time if needed.          BP 130/61  Pulse 78  Temp(Src) 97.6 F (36.4 C) (Oral)  Resp 17  Wt 135 lb (61.236 kg)  SpO2 100% Physical Exam 0750: Physical examination:  Nursing notes reviewed; Vital signs and O2 SAT reviewed;  Constitutional: Well developed, Well nourished, Well hydrated, In no acute distress; Head:  Normocephalic, atraumatic; Eyes: EOMI, PERRL, No scleral icterus; ENMT: Mouth and pharynx normal, Mucous membranes moist; Neck: Supple, Full range of motion, No lymphadenopathy; Cardiovascular: Regular rate and rhythm, No murmur, rub, or gallop; Respiratory: Breath sounds clear & equal bilaterally, No rales, rhonchi, wheezes.  Speaking full sentences with ease, Normal respiratory effort/excursion; Chest: Nontender, Movement normal; Abdomen: Soft, Nontender, Nondistended, Normal bowel sounds; Genitourinary: No CVA tenderness; Extremities: Pulses normal, No tenderness, No edema, No calf edema or asymmetry.; Neuro: AA&Ox3, Major CN grossly intact.  Speech clear. No gross focal motor or sensory deficits in extremities.; Skin: Color normal, Warm, Dry.   ED Course  Procedures    EKG Interpretation    Date/Time:  Saturday August 16 2013 07:29:30 EST Ventricular Rate:  80 PR Interval:  130 QRS Duration: 86 QT Interval:  378 QTC Calculation: 435 R Axis:   65 Text Interpretation:  Normal sinus rhythm Nonspecific ST abnormality Abnormal ECG When compared with ECG of 09-Aug-2012 14:12, Premature atrial complexes are no longer Present Confirmed by [facility]  MD, Nicholos Johns [phone]) on  08/16/2013 8:03:29 AM            MDM  MDM Reviewed: previous chart, nursing note and vitals Reviewed previous: labs and ECG Interpretation: labs, ECG and x-ray      Results for orders placed during the hospital encounter of 08/16/13  CBC WITH DIFFERENTIAL      Result Value Range   WBC 6.2  4.0 - 10.5 K/uL   RBC 4.19  3.87 - 5.11 MIL/uL   Hemoglobin 14.6  12.0 - 15.0 g/dL   HCT 14.7  82.9 - 56.2 %   MCV 99.5  78.0 - 100.0 fL   MCH 34.8 (*) 26.0 - 34.0 pg   MCHC 35.0  30.0 - 36.0 g/dL   RDW 13.0  86.5 - 78.4 %   Platelets 210  150 - 400 K/uL   Neutrophils Relative % 49  43 - 77 %   Neutro  Abs 3.0  1.7 - 7.7 K/uL   Lymphocytes Relative 39  12 - 46 %   Lymphs Abs 2.4  0.7 - 4.0 K/uL   Monocytes Relative 9  3 - 12 %   Monocytes Absolute 0.6  0.1 - 1.0 K/uL   Eosinophils Relative 3  0 - 5 %   Eosinophils Absolute 0.2  0.0 - 0.7 K/uL   Basophils Relative 0  0 - 1 %   Basophils Absolute 0.0  0.0 - 0.1 K/uL  COMPREHENSIVE METABOLIC PANEL      Result Value Range   Sodium 138  137 - 147 mEq/L   Potassium 3.5 (*) 3.7 - 5.3 mEq/L   Chloride 98  96 - 112 mEq/L   CO2 29  19 - 32 mEq/L   Glucose, Bld 95  70 - 99 mg/dL   BUN 17  6 - 23 mg/dL   Creatinine, Ser 4.09  0.50 - 1.10 mg/dL   Calcium 9.8  8.4 - 81.1 mg/dL   Total Protein 7.6  6.0 - 8.3 g/dL   Albumin 3.9  3.5 - 5.2 g/dL   AST 28  0 - 37 U/L   ALT 21  0 - 35 U/L   Alkaline Phosphatase 69  39 - 117 U/L   Total Bilirubin 0.4  0.3 - 1.2 mg/dL   GFR calc non Af Amer 83 (*) >90 mL/min   GFR calc Af Amer >90  >90 mL/min  LIPASE, BLOOD      Result Value Range   Lipase 31  11 - 59 U/L  POCT I-STAT TROPONIN I      Result Value Range   Troponin i, poc 0.00  0.00 - 0.08 ng/mL   Comment 3            Dg Chest 2 View 08/16/2013   CLINICAL DATA:  Chest pain  EXAM: CHEST  2 VIEW  COMPARISON:  DG CHEST 2 VIEW dated 01/08/2012  FINDINGS: The heart size and mediastinal contours are within normal limits. Both lungs are clear. The  visualized skeletal structures are unremarkable.  IMPRESSION: No active cardiopulmonary disease.   Electronically Signed   By: Salome Holmes M.D.   On: 08/16/2013 08:11    0945:  Continues to deny symptoms while in the ED. Has already taken ASA today. TIMI 2 by hx. Dx and testing d/w pt and family.  Questions answered.  Verb understanding, agreeable to admit. T/C to Dr. Felecia Shelling, case discussed, including:  HPI, pertinent PM/SHx, VS/PE, dx testing, ED course and treatment:  Agreeable to observation admit, requests to write temporary orders, obtain tele bed to Dr. Alonza Smoker service.   Laray Anger, DO 08/19/13 [phone]

## 2013-08-16 NOTE — ED Notes (Signed)
Patient with c/o epigastric pain that radiates around both sides and to upper/mid back. Describes pain as nagging. +Nausea

## 2013-08-16 NOTE — H&P (Signed)
Misty [name] MRN: [id number] DOB/AGE: 73/08/1940 73 y.o. Primary Care Physician:FAGAN,ROY, MD Admit date: 08/16/2013 Chief Complaint:  Chest pain since last night HPI:  This is a 73 years old female with history of GERD and hypothyroidism came to [facility] with the above complaint. The pain is mainly in the lower chest just above and around epigastrium. It is deep aching type radiating to the back area on both side. She also has neck pain and shoulder pain. She was recently told she has arthritis and probably pinched nerve. Gives history of nausea, but no vomiting or shortness of breath. No cough, fever, chills, abdominal pain, dysuria, urgency or frequency of urination. No significant  cardiac history in the family.  Past Medical History  Diagnosis Date  . Hypothyroidism   . GERD (gastroesophageal reflux disease)     refractory   . Arthritis   . Anxiety   . PONV (postoperative nausea and vomiting)   . Spinal headache     pt. states when she had rectocele repair she had a spinal & had a bad headache after  . Hypertension   . Chronic neck pain   . IBS (irritable bowel syndrome)   . Equivocal stress test 2009    NS STTW changes   Past Surgical History  Procedure Laterality Date  . Abdominal hysterectomy  1985  . Cholecystectomy  2009    Dr. Lovell SheehanJenkins  . Rectocele repair    . Tonsillectomy  youth  . Appendectomy  teenager  . Bunoinectomy      left foot  . Dilation and curettage of uterus      x 2  . Cystocele repair N/A 08/16/2012    Procedure: ANTERIOR REPAIR (CYSTOCELE);  Surgeon: Tilda BurrowJohn V Ferguson, MD;  Location: [facility];  Service: Gynecology;  Laterality: N/A;  . Colonoscopy  12/26/2003    WUJ:WJXBJYRMR:Normal rectum/Sessile, infiltrating, ulcerated process adjacent to the ileocecal valve suspicious for colonic neoplasm, biopsied multiple times/Shallow left-sided diverticula  . Colonoscopy  03/04/2004    NWG:NFAOZHRMR:Normal rectum/Shallow left-sided diverticula/Previously noted ulcerated area in the right  colon was gone/Aspirin and nonsteroidal anti-inflammatory drugs are well-known to produce stricture, webs, and ulcerations throughout the upper gastrointestinal tract, small bowel, and even proximal colon  . Esophagogastroduodenoscopy  08/16/2007    YQM:VHQIONRMR:Normal esophagus, stomach, D1, D2.        Family History  Problem Relation Age of Onset  . Colon cancer Neg Hx     Social History:  reports that she has never smoked. She does not have any smokeless tobacco history on file. She reports that she drinks about 0.6 ounces of alcohol per week. She reports that she does not use illicit drugs.   Allergies:  Allergies  Allergen Reactions  . Asa [Aspirin] Swelling    Does take coated aspirin  . Demerol [Meperidine] Nausea And Vomiting  . Vicodin [Hydrocodone-Acetaminophen] Swelling    Swelling of face and turns skin orange  . Tramadol Nausea And Vomiting    Medications Prior to Admission  Medication Sig Dispense Refill  . acetaminophen (TYLENOL) 650 MG CR tablet Take 325 mg by mouth every 8 (eight) hours as needed for pain.      Marland Kitchen. aspirin EC 81 MG tablet Take 81 mg by mouth daily.      Marland Kitchen. bismuth subsalicylate (PEPTO BISMOL) 262 MG/15ML suspension Take 15 mLs by mouth every 6 (six) hours as needed for indigestion.      Marland Kitchen. estradiol (ESTRACE) 1 MG tablet Take 0.5 mg by mouth every  other day.       . hydroxypropyl methylcellulose (ISOPTO TEARS) 2.5 % ophthalmic solution Place 1 drop into both eyes daily as needed for dry eyes.      Marland Kitchen levothyroxine (SYNTHROID) 112 MCG tablet Take 112 mcg by mouth daily. Patient has to have Brand Name Drug      . LORazepam (ATIVAN) 0.5 MG tablet Take 0.25 mg by mouth at bedtime.            ZOX:WRUEA from the symptoms mentioned above,there are no other symptoms referable to all systems reviewed.  Physical Exam: Blood pressure 139/60, pulse 79, temperature 98.3 F (36.8 C), temperature source Oral, resp. rate 18, weight 61.236 kg (135 lb), SpO2  100.00%. General Condition- alert, awake and comfortable HE ENT- pulips equal and reactive Respiratory- good air entry, clear lung field CVS-S1 and S2 heard, no murmur or gallop ABD- soft and lax, bowel sound ++ EXT- no leg edema    Recent Labs  08/16/13 0740  WBC 6.2  NEUTROABS 3.0  HGB 14.6  HCT 41.7  MCV 99.5  PLT 210    Recent Labs  08/16/13 0740  NA 138  K 3.5*  CL 98  CO2 29  GLUCOSE 95  BUN 17  CREATININE 0.75  CALCIUM 9.8  lablast2(ast:2,ALT:2,alkphos:2,bilitot:2,prot:2,albumin:2)@    No results found for this or any previous visit (from the past 240 hour(s)).   Dg Chest 2 View  08/16/2013   CLINICAL DATA:  Chest pain  EXAM: CHEST  2 VIEW  COMPARISON:  DG CHEST 2 VIEW dated 01/08/2012  FINDINGS: The heart size and mediastinal contours are within normal limits. Both lungs are clear. The visualized skeletal structures are unremarkable.  IMPRESSION: No active cardiopulmonary disease.   Electronically Signed   By: Salome Holmes M.D.   On: 08/16/2013 08:11   Impression:  Active Problems:   Chest pain GERD Hypothyroidism    Plan: Admit under telemetry Will serial EKG and cardiac enzymes Continue regular treatment.      Shaylie Eklund   08/16/2013, 5:42 PM

## 2013-08-16 NOTE — Discharge Summary (Deleted)
Patient's vital signs were stable at discharge. Patient was alert and oriented at discharge. Discharge instructions, medication guidelines, and follow-up care was discussed with the patient. Patient verbalized understanding of discharge instructions, medication guidelines, and follow-up care.

## 2013-08-17 LAB — TROPONIN I

## 2013-08-17 MED ORDER — METAXALONE 800 MG PO TABS: 800.0000 mg | ORAL_TABLET | Freq: Two times a day (BID) | ORAL | Status: DC | PRN

## 2013-08-17 MED ORDER — ESOMEPRAZOLE MAGNESIUM 40 MG PO PACK: 40.0000 mg | PACK | Freq: Every day | ORAL | Status: DC

## 2013-08-17 NOTE — Progress Notes (Signed)
Pt a/o.vss. Saline lock removed. No complaints of any distress. Up ad lib. Discharge instructions given. Pt verbalized understanding of instructions. Pt left floor via wheelchair with nursing staff and family members.

## 2013-08-17 NOTE — Progress Notes (Signed)
Utilization review completed.  

## 2013-08-18 NOTE — Discharge Summary (Signed)
NAMThomasenia [name]:  Courtney, Porchea            ACCOUNT NO.:  [id number]  MEDICAL RECORD NO.:  [id number]  LOCATION:  A304                          FACILITY:  [facility]  PHYSICIAN:  Kingsley Callanderoy O. Ouida SillsFagan, MD       DATE OF BIRTH:  [birth year]  DATE OF ADMISSION:  08/16/2013 DATE OF DISCHARGE:  02/08/2015LH                              DISCHARGE SUMMARY   DISCHARGE DIAGNOSES: 1. Chest pain. 2. Gastroesophageal reflux disease. 3. Osteoarthritis. 4. Hypothyroidism. 5. Anxiety.  DISCHARGE MEDICATIONS: 1. Nexium 40 mg daily. 2. Skelaxin 800 mg b.i.d. p.r.n. 3. Acetaminophen 650 mg q.8 hours p.r.n. 4. Aspirin 81 mg daily. 5. Pepto-Bismol 15 mL q.6 p.r.n. 6. Estrace 0.5 mg every other day. 7. Isopto Tears 2.5% ophthalmic solution 1 drop in both eyes daily as     needed. 8. Synthroid 112 mcg daily. 9. Lorazepam 0.25 mg at bedtime.  HOSPITAL COURSE:  This patient is a 73 year old female who presented with lower midline sternal chest pain radiating bilaterally.  Her EKG initially revealed no ischemic changes.  She was hospitalized for observation and underwent serial troponin measurements, which were normal.  Her symptoms resolved.  She was felt to likely have a GI source such as esophageal reflux.  She has a history of reflux and states that she ate some onions and beans that may have caused her symptoms the next morning.  She remained hemodynamically stable while hospitalized.  Blood counts were normal.  Chemistries were normal.  She is not hypertensive, diabetic, or hyperlipidemic.  She does not smoke.  She is being treated with Nexium.  She has recently used some ibuprofen, which will be discontinued.  She complains of osteoarthritis symptoms, which will be treated with acetaminophen.  She also requested a trial of some Skelaxin, this was prescribed.  Condition is much improved on the morning of the 8th.  She will be seen in followup in my office in 2 weeks.  She will avoid anti-inflammatory drugs and  will take Nexium each morning before breakfast.     Kingsley Callanderoy O. Ouida SillsFagan, MD     ROF/MEDQ  D:  08/17/2013  T:  08/18/2013  Job:  [id number]

## 2013-10-02 ENCOUNTER — Ambulatory Visit (INDEPENDENT_AMBULATORY_CARE_PROVIDER_SITE_OTHER): Payer: Medicare PPO | Admitting: Gastroenterology

## 2013-10-02 ENCOUNTER — Encounter: Payer: Self-pay | Admitting: Gastroenterology

## 2013-10-02 VITALS — BP 126/73 | HR 87 | Temp 97.6°F | Ht 62.0 in | Wt 134.6 lb

## 2013-10-02 DIAGNOSIS — K219 Gastro-esophageal reflux disease without esophagitis: Secondary | ICD-10-CM

## 2013-10-02 MED ORDER — RABEPRAZOLE SODIUM 20 MG PO TBEC: 20.0000 mg | DELAYED_RELEASE_TABLET | Freq: Every day | ORAL | Status: DC

## 2013-10-02 NOTE — Assessment & Plan Note (Signed)
73 year old female with reflux exacerbation recently in the setting of significant stress and lack of PPI. Since resuming Nexium and adding Omeprazole, she has noted improvement. Vague upper abdominal discomfort, bloating, belching. No dysphagia, melena. Appears diet likely playing a role as well. She desires to try Aciphex; I have sent this to her pharmacy for once a day dosing. I discussed that if she continues to have issues with once daily dosing, an EGD should definitely be considered to rule out occult process. She is hesitant to pursue invasive evaluation at this time. We will have her return in 4-6 weeks for close follow-up. As of note, last EGD in 2009. Due for screening colonoscopy June 2015.

## 2013-10-02 NOTE — Patient Instructions (Signed)
Stop Nexium and Prilosec. I have sent Aciphex to your pharmacy. Take this each morning; you may take over the counter Zantac in the evening if needed.   We will see you in 4-6 weeks. Please call me if you do not feel better!

## 2013-10-02 NOTE — Progress Notes (Signed)
Referring Provider: Carylon PerchesFagan, Roy, MD Primary Care Physician:  Carylon PerchesFAGAN,ROY, MD Primary GI: Dr. Jena Gaussourk  Chief Complaint  Patient presents with  . Gastrophageal Reflux    HPI:   Pleasant 73 year old female with history of GERD, last seen in June 2014, presents today with worsening GERD symptoms in the setting of significant social stressors. Last EGD in 2009 normal. Actually due for screening colonoscopy summer 2015. At last visit,she had been taking Alka-seltzers and not following a GERD diet. Anxiety likely playing a role. Son passed away in Feb 2015. Presented to ED a few days before son passed away. She states she had heard he was not doing well, and subsequently developed chest pain, abdominal pain. EKG and troponins unremarkable. Thought to be secondary to reflux. States she had not been taking a PPI for several months and just started taking it again after that hospitalization.   Nexium prior to breakfast and Omeprazole around lunch. Still notes some upper abdominal discomfort, gas, belching. Intermittent. Worsened by certain foods. Mild nausea. Last few days has felt better. Feels she is improving overall. With getting out and about feels some better. Wants to try Aciphex. No dysphagia. Taken Catering managerAlka Seltzer rarely. No melena. No lower GI symptoms. Hesitant to pursue EGD. Wants to try Aciphex and diet changes first.     Past Medical History  Diagnosis Date  . Hypothyroidism   . GERD (gastroesophageal reflux disease)     refractory   . Arthritis   . Anxiety   . PONV (postoperative nausea and vomiting)   . Spinal headache     pt. states when she had rectocele repair she had a spinal & had a bad headache after  . Hypertension   . Chronic neck pain   . IBS (irritable bowel syndrome)   . Equivocal stress test 2009    NS STTW changes    Past Surgical History  Procedure Laterality Date  . Abdominal hysterectomy  1985  . Cholecystectomy  2009    Dr. Lovell SheehanJenkins  . Rectocele repair    .  Tonsillectomy  youth  . Appendectomy  teenager  . Bunoinectomy      left foot  . Dilation and curettage of uterus      x 2  . Cystocele repair N/A 08/16/2012    Procedure: ANTERIOR REPAIR (CYSTOCELE);  Surgeon: Tilda BurrowJohn V Ferguson, MD;  Location: [facility];  Service: Gynecology;  Laterality: N/A;  . Colonoscopy  12/26/2003    ZOX:WRUEAVRMR:Normal rectum/Sessile, infiltrating, ulcerated process adjacent to the ileocecal valve suspicious for colonic neoplasm, biopsied multiple times/Shallow left-sided diverticula  . Colonoscopy  03/04/2004    WUJ:WJXBJYRMR:Normal rectum/Shallow left-sided diverticula/Previously noted ulcerated area in the right colon was gone/Aspirin and nonsteroidal anti-inflammatory drugs are well-known to produce stricture, webs, and ulcerations throughout the upper gastrointestinal tract, small bowel, and even proximal colon  . Esophagogastroduodenoscopy  08/16/2007    NWG:NFAOZHRMR:Normal esophagus, stomach, D1, D2.    Current Outpatient Prescriptions  Medication Sig Dispense Refill  . acetaminophen (TYLENOL) 650 MG CR tablet Take 325 mg by mouth every 8 (eight) hours as needed for pain.      Marland Kitchen. aspirin EC 81 MG tablet Take 81 mg by mouth daily.      Marland Kitchen. esomeprazole (NEXIUM) 40 MG packet Take 40 mg by mouth daily before breakfast.  30 each  12  . levothyroxine (SYNTHROID) 112 MCG tablet Take 112 mcg by mouth daily. Patient has to have Brand Name Drug      . LORazepam (  ATIVAN) 0.5 MG tablet Take 0.25 mg by mouth at bedtime.       Marland Kitchen omeprazole (PRILOSEC OTC) 20 MG tablet Take 20 mg by mouth daily.      Marland Kitchen bismuth subsalicylate (PEPTO BISMOL) 262 MG/15ML suspension Take 15 mLs by mouth every 6 (six) hours as needed for indigestion.      Marland Kitchen estradiol (ESTRACE) 1 MG tablet Take 0.5 mg by mouth every other day.       . hydroxypropyl methylcellulose (ISOPTO TEARS) 2.5 % ophthalmic solution Place 1 drop into both eyes daily as needed for dry eyes.      . metaxalone (SKELAXIN) 800 MG tablet Take 1 tablet (800 mg total)  by mouth 2 (two) times daily as needed for muscle spasms.  60 tablet  4   No current facility-administered medications for this visit.    Allergies as of 10/02/2013 - Review Complete 10/02/2013  Allergen Reaction Noted  . Asa [aspirin] Swelling 08/16/2013  . Demerol [meperidine] Nausea And Vomiting 08/06/2012  . Vicodin [hydrocodone-acetaminophen] Swelling 08/06/2012  . Tramadol Nausea And Vomiting 08/16/2012    Family History  Problem Relation Age of Onset  . Colon cancer Neg Hx     History   Social History  . Marital Status: Married    Spouse Name: N/A    Number of Children: N/A  . Years of Education: N/A   Social History Main Topics  . Smoking status: Never Smoker   . Smokeless tobacco: None  . Alcohol Use: 0.6 oz/week    1 Glasses of wine per week     Comment: social, rare wine  . Drug Use: No  . Sexual Activity: None   Other Topics Concern  . None   Social History Narrative  . None    Review of Systems: As mentioned in HPI.   Physical Exam: BP 126/73  Pulse 87  Temp(Src) 97.6 F (36.4 C) (Oral)  Ht 5\' 2"  (1.575 m)  Wt 134 lb 9.6 oz (61.054 kg)  BMI 24.61 kg/m2 General:   Alert and oriented. No distress noted. Pleasant and cooperative.  Head:  Normocephalic and atraumatic. Eyes:  Conjuctiva clear without scleral icterus. Mouth:  Oral mucosa pink and moist. Good dentition. No lesions. Heart:  S1, S2 present without murmurs, rubs, or gallops. Regular rate and rhythm. Abdomen:  +BS, soft, non-tender and non-distended. No rebound or guarding. No HSM or masses noted. Msk:  Symmetrical without gross deformities. Normal posture. Extremities:  Without edema. Neurologic:  Alert and  oriented x4;  grossly normal neurologically. Skin:  Intact without significant lesions or rashes. Psych:  Alert and cooperative. Normal mood and affect.  Lab Results  Component Value Date   WBC 6.2 08/16/2013   HGB 14.6 08/16/2013   HCT 41.7 08/16/2013   MCV 99.5 08/16/2013   PLT  210 08/16/2013   Lab Results  Component Value Date   ALT 21 08/16/2013   AST 28 08/16/2013   ALKPHOS 69 08/16/2013   BILITOT 0.4 08/16/2013   Lab Results  Component Value Date   LIPASE 31 08/16/2013

## 2013-10-06 NOTE — Progress Notes (Signed)
cc'd to pcp 

## 2013-10-09 ENCOUNTER — Other Ambulatory Visit: Payer: Self-pay | Admitting: Internal Medicine

## 2013-10-09 ENCOUNTER — Telehealth: Payer: Self-pay | Admitting: *Deleted

## 2013-10-09 DIAGNOSIS — R1013 Epigastric pain: Secondary | ICD-10-CM

## 2013-10-09 NOTE — Telephone Encounter (Signed)
Misty [name] called Misty [name] has a appointment 10/30/13 with Gerrit HallsAnna Sams, per Misty [name] anna told her to call back if she is not any better and the office visit was for a EGD, and Misty [name] wants to know if she needs to come for the office visit or can she go ahead and schedule a EGD. Per Misty [name] Tobi Bastosnna told her if she is not any better they could go ahead and schedule a EGD. Misty [name] also stated she is under a lot of stress because she lost her son not to long ago and Misty [name] thinks that may could be part of her problem. Please Advise [phone] or [phone] Misty [name] said if she does not [facility] to [facility].

## 2013-10-09 NOTE — Telephone Encounter (Signed)
Benedetto GoadLeighann, please call and schedule egd for pt. Thanks.

## 2013-10-09 NOTE — Telephone Encounter (Signed)
   Yes, we need to set her up for an EGD with Dr. Jena Gaussourk. She noted vague dyspepsia, no dysphagia. Gallbladder absent. I prescribed Aciphex at last visit; she was hesitant to pursue an EGD at that time, but I believe it is necessary. Let's go ahead and schedule. No OV needed as she was just seen.

## 2013-10-09 NOTE — Telephone Encounter (Signed)
EGD w/RMR is Thursday April 9th at 1:00 and I have mailed her instructions and she is aware

## 2013-10-09 NOTE — Telephone Encounter (Signed)
Misty [name] pt was just seen on 10/02/13, can we just set her up for an egd?

## 2013-10-10 ENCOUNTER — Encounter (HOSPITAL_COMMUNITY): Payer: Self-pay | Admitting: Pharmacy Technician

## 2013-10-16 ENCOUNTER — Ambulatory Visit (HOSPITAL_COMMUNITY)
Admission: RE | Admit: 2013-10-16 | Discharge: 2013-10-16 | Disposition: A | Discharge status: Home / Self Care | Payer: Medicare PPO | Source: Ambulatory Visit | Attending: Internal Medicine | Admitting: Internal Medicine

## 2013-10-16 ENCOUNTER — Encounter (HOSPITAL_COMMUNITY): Payer: Self-pay

## 2013-10-16 ENCOUNTER — Encounter (HOSPITAL_COMMUNITY)
Admission: RE | Disposition: A | Discharge status: Home / Self Care | Payer: Self-pay | Source: Ambulatory Visit | Attending: Internal Medicine

## 2013-10-16 DIAGNOSIS — K219 Gastro-esophageal reflux disease without esophagitis: Secondary | ICD-10-CM | POA: Insufficient documentation

## 2013-10-16 DIAGNOSIS — K449 Diaphragmatic hernia without obstruction or gangrene: Secondary | ICD-10-CM | POA: Insufficient documentation

## 2013-10-16 DIAGNOSIS — F411 Generalized anxiety disorder: Secondary | ICD-10-CM | POA: Insufficient documentation

## 2013-10-16 DIAGNOSIS — Z7982 Long term (current) use of aspirin: Secondary | ICD-10-CM | POA: Insufficient documentation

## 2013-10-16 DIAGNOSIS — Z79899 Other long term (current) drug therapy: Secondary | ICD-10-CM | POA: Insufficient documentation

## 2013-10-16 DIAGNOSIS — K3189 Other diseases of stomach and duodenum: Secondary | ICD-10-CM | POA: Insufficient documentation

## 2013-10-16 DIAGNOSIS — I10 Essential (primary) hypertension: Secondary | ICD-10-CM | POA: Insufficient documentation

## 2013-10-16 DIAGNOSIS — K2940 Chronic atrophic gastritis without bleeding: Secondary | ICD-10-CM | POA: Insufficient documentation

## 2013-10-16 DIAGNOSIS — R1013 Epigastric pain: Secondary | ICD-10-CM

## 2013-10-16 HISTORY — PX: ESOPHAGOGASTRODUODENOSCOPY: SHX5428

## 2013-10-16 SURGERY — EGD (ESOPHAGOGASTRODUODENOSCOPY)
Anesthesia: Moderate Sedation

## 2013-10-16 MED ORDER — MIDAZOLAM HCL 5 MG/5ML IJ SOLN
INTRAMUSCULAR | Status: AC
  Filled 2013-10-16: qty 10

## 2013-10-16 MED ORDER — STERILE WATER FOR IRRIGATION IR SOLN
Status: DC | PRN
  Administered 2013-10-16: 13:00:00

## 2013-10-16 MED ORDER — FENTANYL CITRATE 0.05 MG/ML IJ SOLN
INTRAMUSCULAR | Status: DC | PRN
  Administered 2013-10-16 (×2): 50 ug via INTRAVENOUS
  Administered 2013-10-16: 25 ug via INTRAVENOUS

## 2013-10-16 MED ORDER — FENTANYL CITRATE 0.05 MG/ML IJ SOLN
INTRAMUSCULAR | Status: AC
  Filled 2013-10-16: qty 4

## 2013-10-16 MED ORDER — LIDOCAINE VISCOUS 2 % MT SOLN
OROMUCOSAL | Status: DC | PRN
  Administered 2013-10-16: 1 via OROMUCOSAL

## 2013-10-16 MED ORDER — ONDANSETRON HCL 4 MG/2ML IJ SOLN
INTRAMUSCULAR | Status: AC
  Filled 2013-10-16: qty 2

## 2013-10-16 MED ORDER — LIDOCAINE VISCOUS 2 % MT SOLN
OROMUCOSAL | Status: AC
  Filled 2013-10-16: qty 15

## 2013-10-16 MED ORDER — MIDAZOLAM HCL 5 MG/5ML IJ SOLN
INTRAMUSCULAR | Status: DC | PRN
  Administered 2013-10-16 (×2): 2 mg via INTRAVENOUS
  Administered 2013-10-16: 1 mg via INTRAVENOUS

## 2013-10-16 MED ORDER — SODIUM CHLORIDE 0.9 % IV SOLN
INTRAVENOUS | Status: DC
  Administered 2013-10-16: 12:00:00 via INTRAVENOUS

## 2013-10-16 MED ORDER — ONDANSETRON HCL 4 MG/2ML IJ SOLN
INTRAMUSCULAR | Status: DC | PRN
  Administered 2013-10-16: 4 mg via INTRAVENOUS

## 2013-10-16 NOTE — H&P (View-Only) (Signed)
Referring Provider: Carylon PerchesFagan, Roy, MD Primary Care Physician:  Carylon PerchesFAGAN,ROY, MD Primary GI: Dr. Jena Gaussourk  Chief Complaint  Patient presents with  . Gastrophageal Reflux    HPI:   Pleasant 73 year old female with history of GERD, last seen in June 2014, presents today with worsening GERD symptoms in the setting of significant social stressors. Last EGD in 2009 normal. Actually due for screening colonoscopy summer 2015. At last visit,she had been taking Alka-seltzers and not following a GERD diet. Anxiety likely playing a role. Son passed away in Feb 2015. Presented to ED a few days before son passed away. She states she had heard he was not doing well, and subsequently developed chest pain, abdominal pain. EKG and troponins unremarkable. Thought to be secondary to reflux. States she had not been taking a PPI for several months and just started taking it again after that hospitalization.   Nexium prior to breakfast and Omeprazole around lunch. Still notes some upper abdominal discomfort, gas, belching. Intermittent. Worsened by certain foods. Mild nausea. Last few days has felt better. Feels she is improving overall. With getting out and about feels some better. Wants to try Aciphex. No dysphagia. Taken Catering managerAlka Seltzer rarely. No melena. No lower GI symptoms. Hesitant to pursue EGD. Wants to try Aciphex and diet changes first.     Past Medical History  Diagnosis Date  . Hypothyroidism   . GERD (gastroesophageal reflux disease)     refractory   . Arthritis   . Anxiety   . PONV (postoperative nausea and vomiting)   . Spinal headache     pt. states when she had rectocele repair she had a spinal & had a bad headache after  . Hypertension   . Chronic neck pain   . IBS (irritable bowel syndrome)   . Equivocal stress test 2009    NS STTW changes    Past Surgical History  Procedure Laterality Date  . Abdominal hysterectomy  1985  . Cholecystectomy  2009    Dr. Lovell SheehanJenkins  . Rectocele repair    .  Tonsillectomy  youth  . Appendectomy  teenager  . Bunoinectomy      left foot  . Dilation and curettage of uterus      x 2  . Cystocele repair N/A 08/16/2012    Procedure: ANTERIOR REPAIR (CYSTOCELE);  Surgeon: Tilda BurrowJohn V Ferguson, MD;  Location: [facility];  Service: Gynecology;  Laterality: N/A;  . Colonoscopy  12/26/2003    ZOX:WRUEAVRMR:Normal rectum/Sessile, infiltrating, ulcerated process adjacent to the ileocecal valve suspicious for colonic neoplasm, biopsied multiple times/Shallow left-sided diverticula  . Colonoscopy  03/04/2004    WUJ:WJXBJYRMR:Normal rectum/Shallow left-sided diverticula/Previously noted ulcerated area in the right colon was gone/Aspirin and nonsteroidal anti-inflammatory drugs are well-known to produce stricture, webs, and ulcerations throughout the upper gastrointestinal tract, small bowel, and even proximal colon  . Esophagogastroduodenoscopy  08/16/2007    NWG:NFAOZHRMR:Normal esophagus, stomach, D1, D2.    Current Outpatient Prescriptions  Medication Sig Dispense Refill  . acetaminophen (TYLENOL) 650 MG CR tablet Take 325 mg by mouth every 8 (eight) hours as needed for pain.      Marland Kitchen. aspirin EC 81 MG tablet Take 81 mg by mouth daily.      Marland Kitchen. esomeprazole (NEXIUM) 40 MG packet Take 40 mg by mouth daily before breakfast.  30 each  12  . levothyroxine (SYNTHROID) 112 MCG tablet Take 112 mcg by mouth daily. Patient has to have Brand Name Drug      . LORazepam (  ATIVAN) 0.5 MG tablet Take 0.25 mg by mouth at bedtime.       Marland Kitchen omeprazole (PRILOSEC OTC) 20 MG tablet Take 20 mg by mouth daily.      Marland Kitchen bismuth subsalicylate (PEPTO BISMOL) 262 MG/15ML suspension Take 15 mLs by mouth every 6 (six) hours as needed for indigestion.      Marland Kitchen estradiol (ESTRACE) 1 MG tablet Take 0.5 mg by mouth every other day.       . hydroxypropyl methylcellulose (ISOPTO TEARS) 2.5 % ophthalmic solution Place 1 drop into both eyes daily as needed for dry eyes.      . metaxalone (SKELAXIN) 800 MG tablet Take 1 tablet (800 mg total)  by mouth 2 (two) times daily as needed for muscle spasms.  60 tablet  4   No current facility-administered medications for this visit.    Allergies as of 10/02/2013 - Review Complete 10/02/2013  Allergen Reaction Noted  . Asa [aspirin] Swelling 08/16/2013  . Demerol [meperidine] Nausea And Vomiting 08/06/2012  . Vicodin [hydrocodone-acetaminophen] Swelling 08/06/2012  . Tramadol Nausea And Vomiting 08/16/2012    Family History  Problem Relation Age of Onset  . Colon cancer Neg Hx     History   Social History  . Marital Status: Married    Spouse Name: N/A    Number of Children: N/A  . Years of Education: N/A   Social History Main Topics  . Smoking status: Never Smoker   . Smokeless tobacco: None  . Alcohol Use: 0.6 oz/week    1 Glasses of wine per week     Comment: social, rare wine  . Drug Use: No  . Sexual Activity: None   Other Topics Concern  . None   Social History Narrative  . None    Review of Systems: As mentioned in HPI.   Physical Exam: BP 126/73  Pulse 87  Temp(Src) 97.6 F (36.4 C) (Oral)  Ht 5\' 2"  (1.575 m)  Wt 134 lb 9.6 oz (61.054 kg)  BMI 24.61 kg/m2 General:   Alert and oriented. No distress noted. Pleasant and cooperative.  Head:  Normocephalic and atraumatic. Eyes:  Conjuctiva clear without scleral icterus. Mouth:  Oral mucosa pink and moist. Good dentition. No lesions. Heart:  S1, S2 present without murmurs, rubs, or gallops. Regular rate and rhythm. Abdomen:  +BS, soft, non-tender and non-distended. No rebound or guarding. No HSM or masses noted. Msk:  Symmetrical without gross deformities. Normal posture. Extremities:  Without edema. Neurologic:  Alert and  oriented x4;  grossly normal neurologically. Skin:  Intact without significant lesions or rashes. Psych:  Alert and cooperative. Normal mood and affect.  Lab Results  Component Value Date   WBC 6.2 08/16/2013   HGB 14.6 08/16/2013   HCT 41.7 08/16/2013   MCV 99.5 08/16/2013   PLT  210 08/16/2013   Lab Results  Component Value Date   ALT 21 08/16/2013   AST 28 08/16/2013   ALKPHOS 69 08/16/2013   BILITOT 0.4 08/16/2013   Lab Results  Component Value Date   LIPASE 31 08/16/2013

## 2013-10-16 NOTE — Interval H&P Note (Signed)
History and Physical Interval Note:  10/16/2013 1:07 PM  [name]  has presented today for surgery, with the diagnosis of DYSPEPSIA  The various methods of treatment have been discussed with the patient and family. After consideration of risks, benefits and other options for treatment, the patient has consented to  Procedure(s) with comments: ESOPHAGOGASTRODUODENOSCOPY (EGD) (N/A) - 1:00 as a surgical intervention .  The patient's history has been reviewed, patient examined, no change in status, stable for surgery.  I have reviewed the patient's chart and labs.  Questions were answered to the patient's satisfaction.     No change. EGD per plan.The risks, benefits, limitations, alternatives and imponderables have been reviewed with the patient. Potential for esophageal dilation, biopsy, etc. have also been reviewed.  Questions have been answered. All parties agreeable.  Gerrit Friendsobert M Kinnie Kaupp

## 2013-10-16 NOTE — Op Note (Signed)
[facility] [address] [place], [id number]   ENDOSCOPY PROCEDURE REPORT  PATIENT: Misty [name], Misty M.  MR#: [id number] BIRTHDATE: [birth year] , 72  yrs. old GENDER: Female ENDOSCOPIST: R.  Roetta SessionsMichael Rourk, MD FACP FACG REFERRED BY:  Carylon Perchesoy Fagan, M.D. PROCEDURE DATE:  10/16/2013 PROCEDURE:     EGD with gastric biopsy  INDICATIONS:     Refractory dyspepsia  INFORMED CONSENT:   The risks, benefits, limitations, alternatives and imponderables have been discussed.  The potential for biopsy, esophogeal dilation, etc. have also been reviewed.  Questions have been answered.  All parties agreeable.  Please see the history and physical in the medical record for more information.  MEDICATIONS: Versed 5 mg IV and fentanyl 125 mcg IV in divided doses. Xylocaine gel orally. Zofran 4 mg IV  DESCRIPTION OF PROCEDURE:   The [id number] ([id number])  endoscope was introduced through the mouth and advanced to the second portion of the duodenum without difficulty or limitations.  The mucosal surfaces were surveyed very carefully during advancement of the scope and upon withdrawal.  Retroflexion view of the proximal stomach and esophagogastric junction was performed.      FINDINGS:   Normal esophagus. Stomach empty. Some mottling and patchy erythema of the proximal stomach. Small hiatal hernia. No ulcer or infiltrating process. Patent pylorus. Normal first and second portion of the duodenum.  THERAPEUTIC / DIAGNOSTIC MANEUVERS PERFORMED:  Biopsies of the abnormal gastric mucosa taken   COMPLICATIONS:  Noneand a lab work or so that is a half a light and a but she and her  IMPRESSION / RECOMMENDATIONS:  Small hiatal hernia. Abnormal gastric mucosa of doubtful clinical significance-status post biopsy. I am inclined to think her dyspeptic symptoms are more functional in nature more or less directly related to grief reaction. However, will followup on pathology. She may need further  lab work and cross-sectional imaging of her abdomen to wrap her  GI evaluation.  _______________________________ R. Roetta SessionsMichael Rourk, MD FACP [facility] eSigned:  R. Roetta SessionsMichael Rourk, MD FACP [facility] 10/16/2013 1:42 PM     CC:

## 2013-10-16 NOTE — Discharge Instructions (Addendum)
°  Further recommendations to follow pending review of pathology report  You may need further evaluation of abdominal pain in the near future    EGD Discharge instructions Please read the instructions outlined below and refer to this sheet in the next few weeks. These discharge instructions provide you with general information on caring for yourself after you leave the hospital. Your doctor may also give you specific instructions. While your treatment has been planned according to the most current medical practices available, unavoidable complications occasionally occur. If you have any problems or questions after discharge, please call your doctor. ACTIVITY  You may resume your regular activity but move at a slower pace for the next 24 hours.   Take frequent rest periods for the next 24 hours.   Walking will help expel (get rid of) the air and reduce the bloated feeling in your abdomen.   No driving for 24 hours (because of the anesthesia (medicine) used during the test).   You may shower.   Do not sign any important legal documents or operate any machinery for 24 hours (because of the anesthesia used during the test).  NUTRITION  Drink plenty of fluids.   You may resume your normal diet.   Begin with a light meal and progress to your normal diet.   Avoid alcoholic beverages for 24 hours or as instructed by your caregiver.  MEDICATIONS  You may resume your normal medications unless your caregiver tells you otherwise.  WHAT YOU CAN EXPECT TODAY  You may experience abdominal discomfort such as a feeling of fullness or gas pains.  FOLLOW-UP  Your doctor will discuss the results of your test with you.  SEEK IMMEDIATE MEDICAL ATTENTION IF ANY OF THE FOLLOWING OCCUR:  Excessive nausea (feeling sick to your stomach) and/or vomiting.   Severe abdominal pain and distention (swelling).   Trouble swallowing.   Temperature over 101 F (37.8 C).   Rectal bleeding or vomiting  of blood.

## 2013-10-19 ENCOUNTER — Encounter: Payer: Self-pay | Admitting: Internal Medicine

## 2013-10-20 ENCOUNTER — Telehealth: Payer: Self-pay

## 2013-10-20 ENCOUNTER — Other Ambulatory Visit: Payer: Self-pay | Admitting: Internal Medicine

## 2013-10-20 DIAGNOSIS — R140 Abdominal distension (gaseous): Secondary | ICD-10-CM

## 2013-10-20 DIAGNOSIS — G8929 Other chronic pain: Secondary | ICD-10-CM

## 2013-10-20 DIAGNOSIS — R1012 Left upper quadrant pain: Secondary | ICD-10-CM

## 2013-10-20 DIAGNOSIS — R110 Nausea: Secondary | ICD-10-CM

## 2013-10-20 DIAGNOSIS — R1011 Right upper quadrant pain: Principal | ICD-10-CM

## 2013-10-20 NOTE — Telephone Encounter (Signed)
Letter from: Corbin Adeourk, Robert M  Reason for Letter: Results Review  Send letter to patient.  Send copy of letter with path to referring provider and PCP.   Raynelle FanningJulie, pt needs a contrast CT of abd and pelvis to further evaluate sx

## 2013-10-20 NOTE — Telephone Encounter (Signed)
I spoke to patient and she is aware that she is scheduled for CT on Thurs April 16 th at 11:00 am

## 2013-10-20 NOTE — Telephone Encounter (Signed)
Tried to call pt- LMOM 

## 2013-10-21 ENCOUNTER — Encounter (HOSPITAL_COMMUNITY): Payer: Self-pay | Admitting: Internal Medicine

## 2013-10-23 ENCOUNTER — Ambulatory Visit (HOSPITAL_COMMUNITY)
Admission: RE | Admit: 2013-10-23 | Discharge: 2013-10-23 | Disposition: A | Discharge status: Home / Self Care | Payer: Medicare PPO | Source: Ambulatory Visit | Attending: Internal Medicine | Admitting: Internal Medicine

## 2013-10-23 DIAGNOSIS — R141 Gas pain: Secondary | ICD-10-CM | POA: Insufficient documentation

## 2013-10-23 DIAGNOSIS — R1012 Left upper quadrant pain: Secondary | ICD-10-CM

## 2013-10-23 DIAGNOSIS — R109 Unspecified abdominal pain: Secondary | ICD-10-CM | POA: Insufficient documentation

## 2013-10-23 DIAGNOSIS — R140 Abdominal distension (gaseous): Secondary | ICD-10-CM

## 2013-10-23 DIAGNOSIS — G8929 Other chronic pain: Secondary | ICD-10-CM

## 2013-10-23 DIAGNOSIS — R110 Nausea: Secondary | ICD-10-CM | POA: Insufficient documentation

## 2013-10-23 DIAGNOSIS — R143 Flatulence: Secondary | ICD-10-CM

## 2013-10-23 DIAGNOSIS — R142 Eructation: Secondary | ICD-10-CM | POA: Insufficient documentation

## 2013-10-23 DIAGNOSIS — R1011 Right upper quadrant pain: Secondary | ICD-10-CM

## 2013-10-23 LAB — POCT I-STAT CREATININE: CREATININE: 0.8 mg/dL (ref 0.50–1.10)

## 2013-10-23 MED ORDER — IOHEXOL 300 MG/ML  SOLN
100.0000 mL | Freq: Once | INTRAMUSCULAR | Status: AC | PRN
  Administered 2013-10-23: 100 mL via INTRAVENOUS

## 2013-10-26 NOTE — Progress Notes (Signed)
Patient ID: Misty [name], female   DOB: 05/13/1941, 73 y.o.   MRN: [id number]  CT reviewed; benign liver and kidney cysts; everything else looked good.  I recommend adding a probiotic for gas/bloat sx; cont Aciphex; OV w AS in coming 1-552mos from now

## 2013-10-27 NOTE — Progress Notes (Signed)
Noted  

## 2013-10-27 NOTE — Progress Notes (Signed)
Pt has OV with AS on 4/23 at 8 (FU OV in 4-6 weeks per AS)

## 2013-10-27 NOTE — Progress Notes (Signed)
Pt is aware of results. She canceled her appointment for this Thursday and made a new one for 2 months out since she is doing ok.

## 2013-10-30 ENCOUNTER — Ambulatory Visit: Payer: Medicare PPO | Admitting: Gastroenterology

## 2013-12-10 ENCOUNTER — Encounter: Payer: Self-pay | Admitting: Gastroenterology

## 2013-12-10 ENCOUNTER — Telehealth: Payer: Self-pay | Admitting: Gastroenterology

## 2013-12-10 ENCOUNTER — Ambulatory Visit: Payer: Medicare PPO | Admitting: Gastroenterology

## 2013-12-10 NOTE — Telephone Encounter (Signed)
Mailed letter °

## 2013-12-10 NOTE — Telephone Encounter (Signed)
Pt was a no show

## 2014-01-28 ENCOUNTER — Other Ambulatory Visit (HOSPITAL_COMMUNITY): Payer: Self-pay | Admitting: Internal Medicine

## 2014-02-05 ENCOUNTER — Other Ambulatory Visit (HOSPITAL_COMMUNITY): Payer: Self-pay | Admitting: Internal Medicine

## 2014-02-05 DIAGNOSIS — Z1231 Encounter for screening mammogram for malignant neoplasm of breast: Secondary | ICD-10-CM

## 2014-02-12 ENCOUNTER — Ambulatory Visit (HOSPITAL_COMMUNITY)
Admission: RE | Admit: 2014-02-12 | Discharge: 2014-02-12 | Disposition: A | Discharge status: Home / Self Care | Payer: Medicare PPO | Source: Ambulatory Visit | Attending: Internal Medicine | Admitting: Internal Medicine

## 2014-02-12 DIAGNOSIS — Z1231 Encounter for screening mammogram for malignant neoplasm of breast: Secondary | ICD-10-CM | POA: Insufficient documentation

## 2014-09-14 DIAGNOSIS — H35419 Lattice degeneration of retina, unspecified eye: Secondary | ICD-10-CM | POA: Diagnosis not present

## 2014-11-10 DIAGNOSIS — H8111 Benign paroxysmal vertigo, right ear: Secondary | ICD-10-CM | POA: Diagnosis not present

## 2015-01-04 ENCOUNTER — Other Ambulatory Visit: Payer: Self-pay

## 2015-01-04 DIAGNOSIS — H04129 Dry eye syndrome of unspecified lacrimal gland: Secondary | ICD-10-CM | POA: Diagnosis not present

## 2015-01-04 DIAGNOSIS — H25813 Combined forms of age-related cataract, bilateral: Secondary | ICD-10-CM | POA: Diagnosis not present

## 2015-01-04 DIAGNOSIS — H35363 Drusen (degenerative) of macula, bilateral: Secondary | ICD-10-CM | POA: Diagnosis not present

## 2015-01-13 ENCOUNTER — Other Ambulatory Visit (HOSPITAL_COMMUNITY): Payer: Self-pay | Admitting: Internal Medicine

## 2015-01-13 DIAGNOSIS — Z1231 Encounter for screening mammogram for malignant neoplasm of breast: Secondary | ICD-10-CM

## 2015-01-21 IMAGING — CT CT ABD-PELV W/ CM
2 of 4 series · 16 of 46 positions shown, 18 images · IV contrast (Omnipaque 300)
Comparison: None.

CLINICAL DATA: Bilateral abdominal pain.  Nausea.  Bloating.

EXAM:
CT ABDOMEN AND PELVIS WITH CONTRAST
TECHNIQUE: Multidetector CT imaging of the abdomen and pelvis was performed
using the standard protocol following bolus administration of
intravenous contrast.
CONTRAST:  100mL OMNIPAQUE IOHEXOL 300 MG/ML  SOLN

[Series 2: abd_pel_with 5.0 b40f · axial · 0.67mm/px · z∈[+430,+830]mm · 13 of 90 slices shown, 15 images]
[im 5/90  soft-tissue]
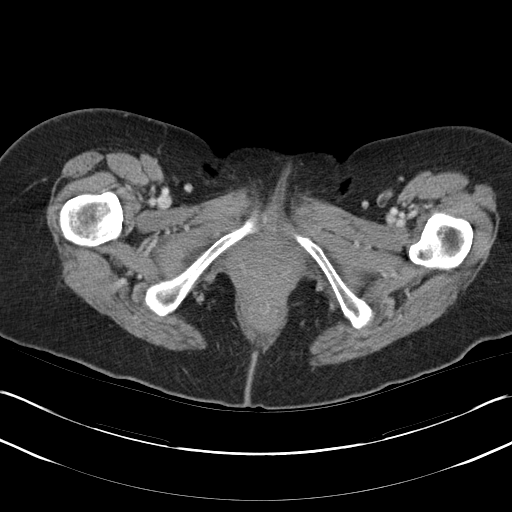
[im 5/90  bone]
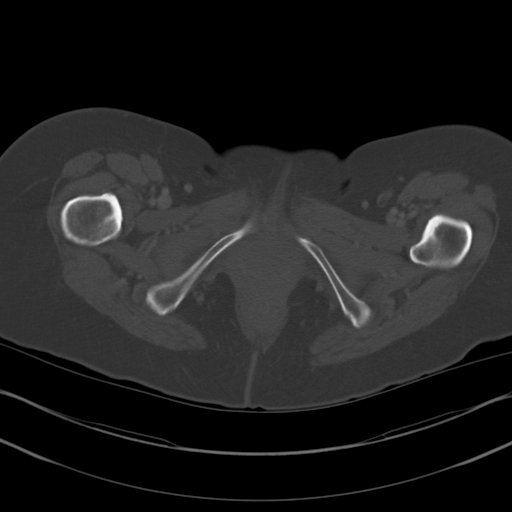
[im 13/90  soft-tissue]
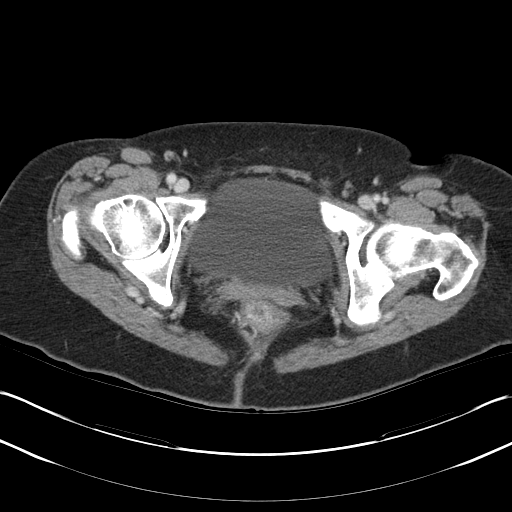
[im 21/90  soft-tissue]
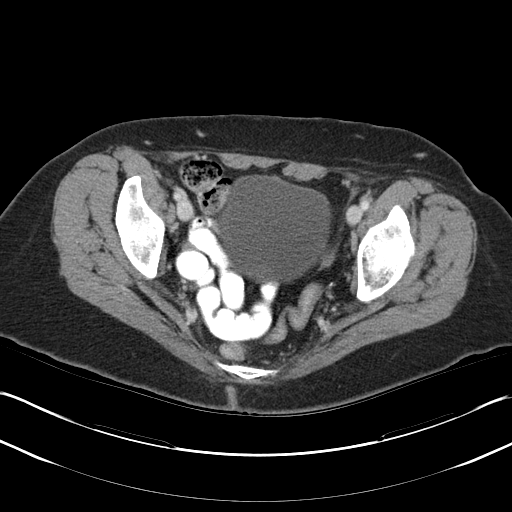
[im 25/90  soft-tissue]
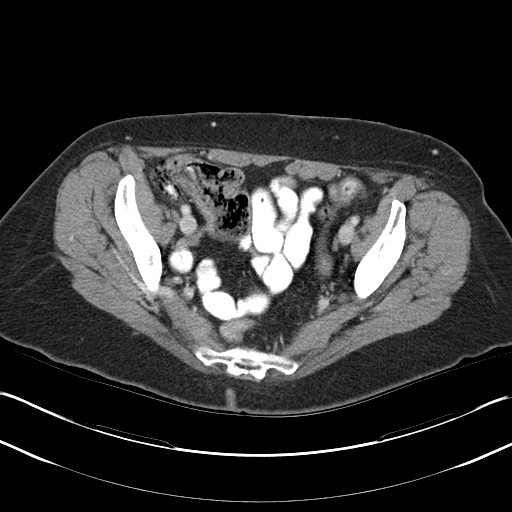
[im 33/90  soft-tissue]
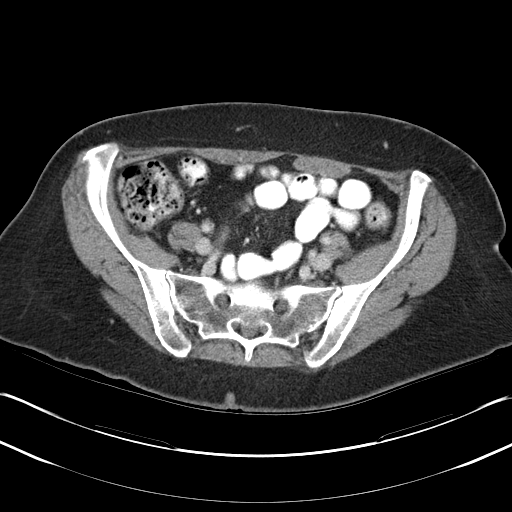
[im 37/90  soft-tissue]
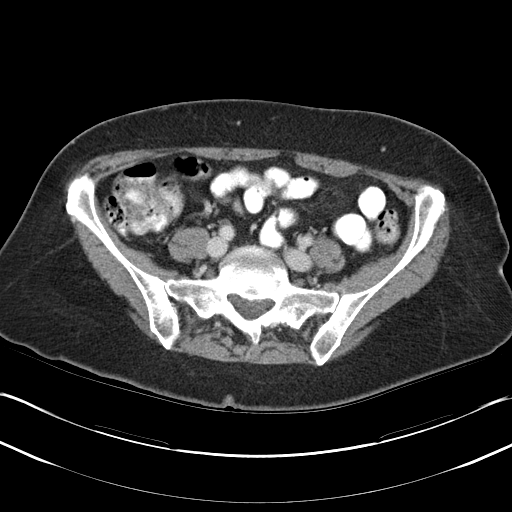
[im 45/90  soft-tissue]
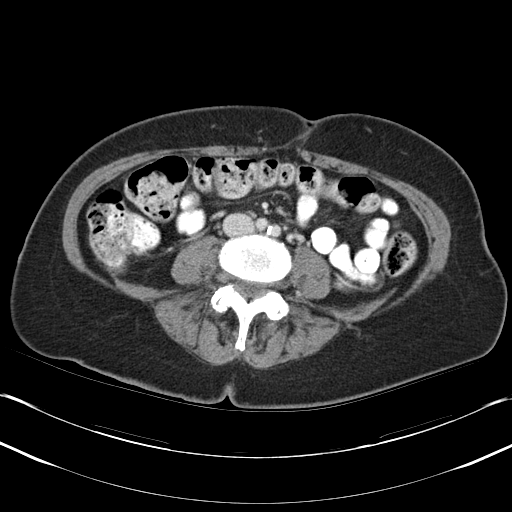
[im 53/90  soft-tissue]
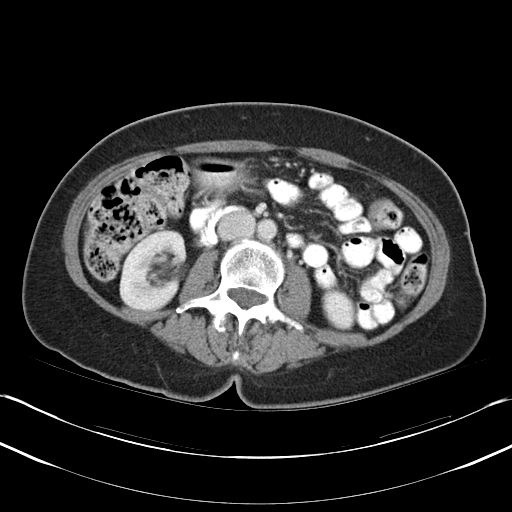
[im 57/90  soft-tissue]
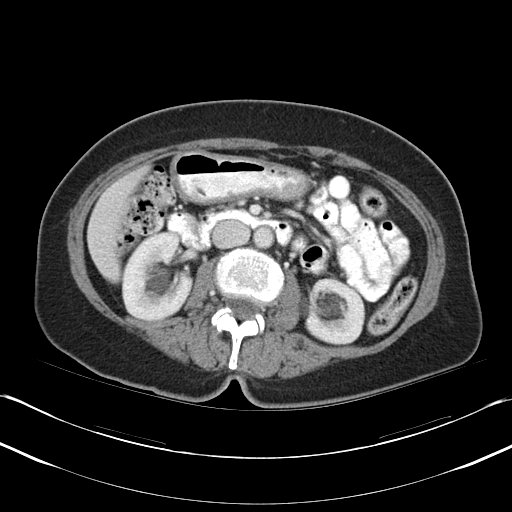
[im 57/90  bone]
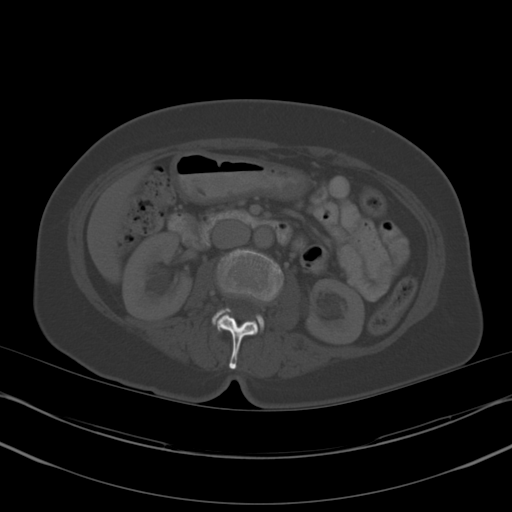
[im 65/90  soft-tissue]
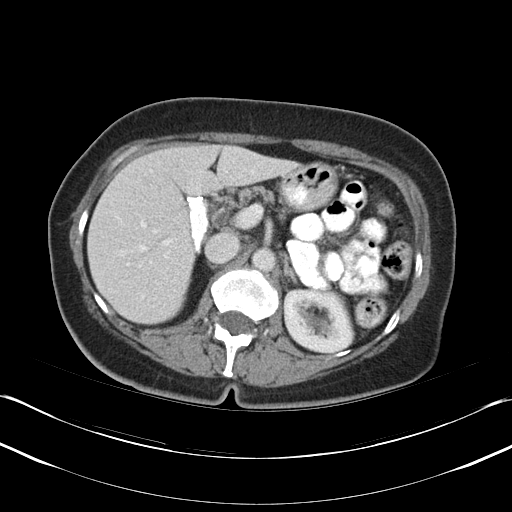
[im 69/90  soft-tissue]
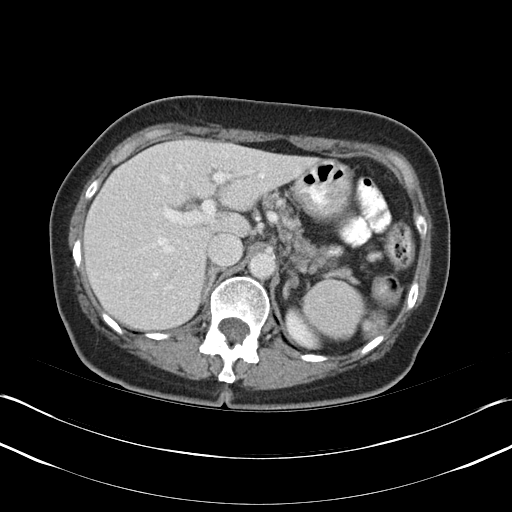
[im 77/90  soft-tissue]
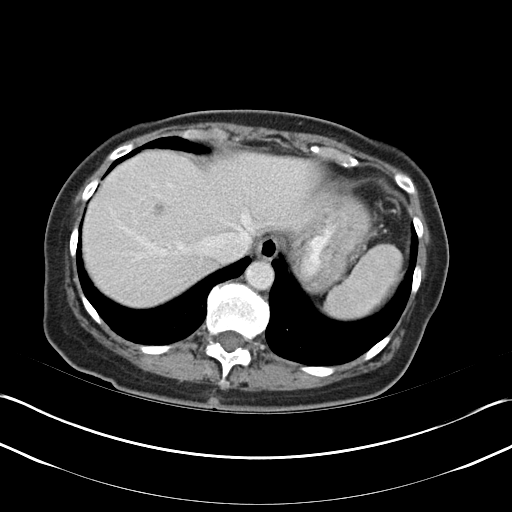
[im 85/90  soft-tissue]
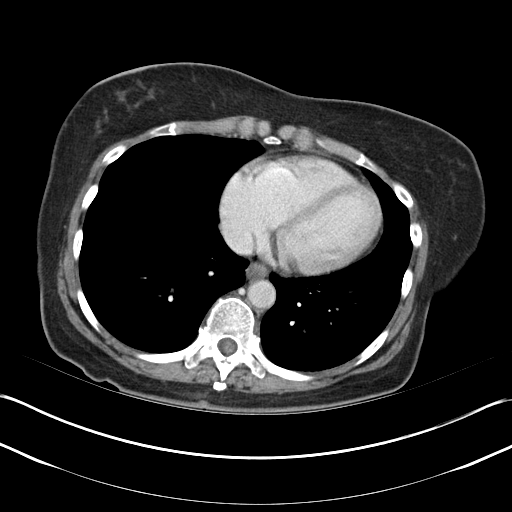

[Series 4: abd_pel_with 3.0 spo cor · coronal · 0.64mm/px · 3 of 66 slices shown]
[im 22/66  soft-tissue]
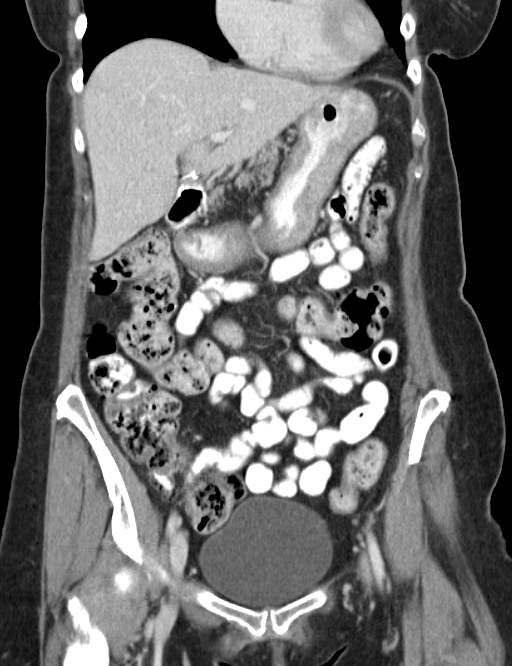
[im 29/66  soft-tissue]
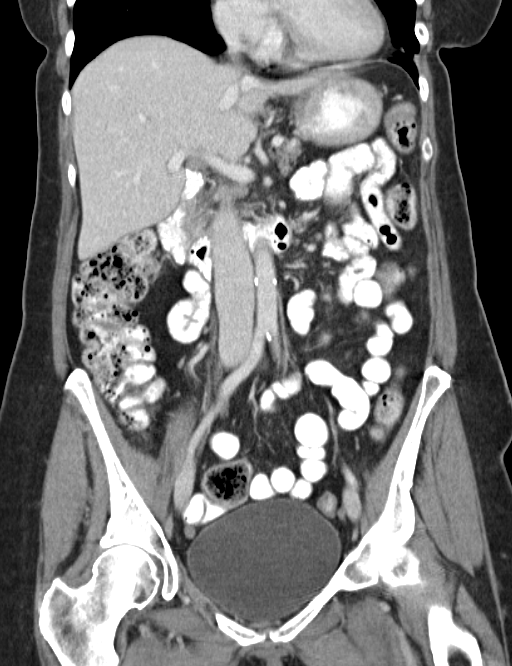
[im 37/66  soft-tissue]
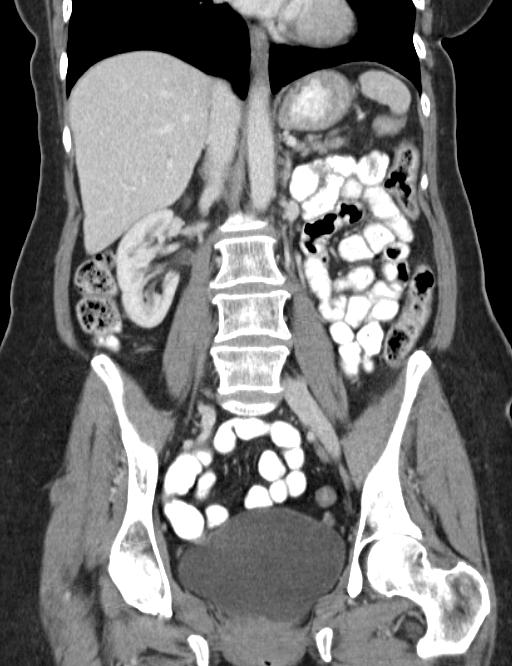

[16 of 46 positions shown; findings below may reference images not displayed]

FINDINGS: A small cyst is seen in the anterior segment of the right hepatic
lobe. No liver masses are identified. Surgical clips from prior
cholecystectomy noted. No evidence of biliary ductal dilatation.

The pancreas, spleen, and adrenal glands are normal in appearance.
Several parapelvic renal cysts are seen bilaterally however there is
no evidence of renal masses or hydronephrosis.

Prior hysterectomy noted. Adnexal regions are unremarkable in
appearance. No soft tissue masses or lymphadenopathy identified
within the abdomen or pelvis. No evidence of inflammatory process or
abnormal fluid collections. No evidence of bowel wall thickening or
dilatation. No suspicious bone lesions identified.
IMPRESSION: No acute findings or other significant abnormality identified.

## 2015-02-15 ENCOUNTER — Ambulatory Visit (HOSPITAL_COMMUNITY)
Admission: RE | Admit: 2015-02-15 | Discharge: 2015-02-15 | Disposition: A | Discharge status: Home / Self Care | Payer: Medicare PPO | Source: Ambulatory Visit | Attending: Internal Medicine | Admitting: Internal Medicine

## 2015-02-15 DIAGNOSIS — Z1231 Encounter for screening mammogram for malignant neoplasm of breast: Secondary | ICD-10-CM | POA: Diagnosis not present

## 2015-02-22 DIAGNOSIS — M1990 Unspecified osteoarthritis, unspecified site: Secondary | ICD-10-CM | POA: Diagnosis not present

## 2015-02-22 DIAGNOSIS — K219 Gastro-esophageal reflux disease without esophagitis: Secondary | ICD-10-CM | POA: Diagnosis not present

## 2015-02-22 DIAGNOSIS — E039 Hypothyroidism, unspecified: Secondary | ICD-10-CM | POA: Diagnosis not present

## 2015-02-22 DIAGNOSIS — Z79899 Other long term (current) drug therapy: Secondary | ICD-10-CM | POA: Diagnosis not present

## 2015-03-01 DIAGNOSIS — Z6825 Body mass index (BMI) 25.0-25.9, adult: Secondary | ICD-10-CM | POA: Diagnosis not present

## 2015-03-01 DIAGNOSIS — M1990 Unspecified osteoarthritis, unspecified site: Secondary | ICD-10-CM | POA: Diagnosis not present

## 2015-03-01 DIAGNOSIS — Z0001 Encounter for general adult medical examination with abnormal findings: Secondary | ICD-10-CM | POA: Diagnosis not present

## 2015-03-01 DIAGNOSIS — G4709 Other insomnia: Secondary | ICD-10-CM | POA: Diagnosis not present

## 2015-05-21 DIAGNOSIS — Z23 Encounter for immunization: Secondary | ICD-10-CM | POA: Diagnosis not present

## 2015-06-28 DIAGNOSIS — Z23 Encounter for immunization: Secondary | ICD-10-CM | POA: Diagnosis not present

## 2015-09-03 ENCOUNTER — Ambulatory Visit (INDEPENDENT_AMBULATORY_CARE_PROVIDER_SITE_OTHER): Payer: Medicare Other | Admitting: Urology

## 2015-09-03 DIAGNOSIS — N3941 Urge incontinence: Secondary | ICD-10-CM | POA: Diagnosis not present

## 2015-09-03 DIAGNOSIS — R351 Nocturia: Secondary | ICD-10-CM

## 2015-09-03 DIAGNOSIS — N952 Postmenopausal atrophic vaginitis: Secondary | ICD-10-CM | POA: Diagnosis not present

## 2015-09-03 DIAGNOSIS — N390 Urinary tract infection, site not specified: Secondary | ICD-10-CM

## 2015-09-09 ENCOUNTER — Emergency Department (HOSPITAL_COMMUNITY): Payer: Medicare Other

## 2015-09-09 ENCOUNTER — Emergency Department (HOSPITAL_COMMUNITY)
Admission: EM | Admit: 2015-09-09 | Discharge: 2015-09-09 | Disposition: A | Discharge status: Home / Self Care | Payer: Medicare Other | Attending: Emergency Medicine | Admitting: Emergency Medicine

## 2015-09-09 ENCOUNTER — Encounter (HOSPITAL_COMMUNITY): Payer: Self-pay | Admitting: *Deleted

## 2015-09-09 DIAGNOSIS — J069 Acute upper respiratory infection, unspecified: Secondary | ICD-10-CM | POA: Diagnosis not present

## 2015-09-09 DIAGNOSIS — R05 Cough: Secondary | ICD-10-CM | POA: Diagnosis present

## 2015-09-09 DIAGNOSIS — I10 Essential (primary) hypertension: Secondary | ICD-10-CM | POA: Insufficient documentation

## 2015-09-09 DIAGNOSIS — G8929 Other chronic pain: Secondary | ICD-10-CM | POA: Insufficient documentation

## 2015-09-09 DIAGNOSIS — K219 Gastro-esophageal reflux disease without esophagitis: Secondary | ICD-10-CM | POA: Diagnosis not present

## 2015-09-09 DIAGNOSIS — Z7982 Long term (current) use of aspirin: Secondary | ICD-10-CM | POA: Diagnosis not present

## 2015-09-09 DIAGNOSIS — E039 Hypothyroidism, unspecified: Secondary | ICD-10-CM | POA: Diagnosis not present

## 2015-09-09 DIAGNOSIS — Z793 Long term (current) use of hormonal contraceptives: Secondary | ICD-10-CM | POA: Diagnosis not present

## 2015-09-09 DIAGNOSIS — N390 Urinary tract infection, site not specified: Secondary | ICD-10-CM | POA: Diagnosis not present

## 2015-09-09 DIAGNOSIS — Z79899 Other long term (current) drug therapy: Secondary | ICD-10-CM | POA: Diagnosis not present

## 2015-09-09 DIAGNOSIS — M1990 Unspecified osteoarthritis, unspecified site: Secondary | ICD-10-CM | POA: Insufficient documentation

## 2015-09-09 DIAGNOSIS — F419 Anxiety disorder, unspecified: Secondary | ICD-10-CM | POA: Insufficient documentation

## 2015-09-09 LAB — URINALYSIS, ROUTINE W REFLEX MICROSCOPIC
BILIRUBIN URINE: NEGATIVE
Glucose, UA: NEGATIVE mg/dL
Hgb urine dipstick: NEGATIVE
Nitrite: NEGATIVE
PROTEIN: NEGATIVE mg/dL
Specific Gravity, Urine: 1.02 (ref 1.005–1.030)
pH: 5.5 (ref 5.0–8.0)

## 2015-09-09 LAB — URINE MICROSCOPIC-ADD ON: RBC / HPF: NONE SEEN RBC/hpf (ref 0–5)

## 2015-09-09 MED ORDER — PROMETHAZINE-DM 6.25-15 MG/5ML PO SYRP: 5.0000 mL | ORAL_SOLUTION | Freq: Three times a day (TID) | ORAL | Status: DC | PRN

## 2015-09-09 MED ORDER — CEPHALEXIN 500 MG PO CAPS: 500.0000 mg | ORAL_CAPSULE | Freq: Four times a day (QID) | ORAL | Status: DC

## 2015-09-09 NOTE — Discharge Instructions (Signed)
Cough, Adult A cough helps to clear your throat and lungs. A cough may last only 2-3 weeks (acute), or it may last longer than 8 weeks (chronic). Many different things can cause a cough. A cough may be a sign of an illness or another medical condition. HOME CARE  Pay attention to any changes in your cough.  Take medicines only as told by your doctor.  If you were prescribed an antibiotic medicine, take it as told by your doctor. Do not stop taking it even if you start to feel better.  Talk with your doctor before you try using a cough medicine.  Drink enough fluid to keep your pee (urine) clear or pale yellow.  If the air is dry, use a cold steam vaporizer or humidifier in your home.  Stay away from things that make you cough at work or at home.  If your cough is worse at night, try using extra pillows to raise your head up higher while you sleep.  Do not smoke, and try not to be around smoke. If you need help quitting, ask your doctor.  Do not have caffeine.  Do not drink alcohol.  Rest as needed. GET HELP IF:  You have new problems (symptoms).  You cough up yellow fluid (pus).  Your cough does not get better after 2-3 weeks, or your cough gets worse.  Medicine does not help your cough and you are not sleeping well.  You have pain that gets worse or pain that is not helped with medicine.  You have a fever.  You are losing weight and you do not know why.  You have night sweats. GET HELP RIGHT AWAY IF:  You cough up blood.  You have trouble breathing.  Your heartbeat is very fast.   This information is not intended to replace advice given to you by your health care provider. Make sure you discuss any questions you have with your health care provider.   Document Released: 03/09/2011 Document Revised: 03/17/2015 Document Reviewed: 09/02/2014 Elsevier Interactive Patient Education 2016 Else[employer] Inc[employer]  Urinary Tract Infection A urinary tract infection (UTI) can  occur any place along the urinary tract. The tract includes the kidneys, ureters, bladder, and urethra. A type of germ called bacteria often causes a UTI. UTIs are often helped with antibiotic medicine.  HOME CARE   If given, take antibiotics as told by your doctor. Finish them even if you start to feel better.  Drink enough fluids to keep your pee (urine) clear or pale yellow.  Avoid tea, drinks with caffeine, and bubbly (carbonated) drinks.  Pee often. Avoid holding your pee in for a long time.  Pee before and after having sex (intercourse).  Wipe from front to back after you poop (bowel movement) if you are a woman. Use each tissue only once. GET HELP RIGHT AWAY IF:   You have back pain.  You have lower belly (abdominal) pain.  You have chills.  You feel sick to your stomach (nauseous).  You throw up (vomit).  Your burning or discomfort with peeing does not go away.  You have a fever.  Your symptoms are not better in 3 days. MAKE SURE YOU:   Understand these instructions.  Will watch your condition.  Will get help right away if you are not doing well or get worse.   This information is not intended to replace advice given to you by your health care provider. Make sure you discuss any questions you have with  your health care provider.   Document Released: 12/13/2007 Document Revised: 07/17/2014 Document Reviewed: 01/25/2012 Elsevier Interactive Patient Education [employer]

## 2015-09-09 NOTE — ED Provider Notes (Signed)
CSN: [id number]     Arrival date & time 09/09/15  0809 History   First MD Initiated Contact with Patient 09/09/15 [phone]     Chief Complaint  Patient presents with  . Cough     (Consider location/radiation/quality/duration/timing/severity/associated sxs/prior Treatment) HPI   Misty [name] is a 75 y.o. female who presents to the Emergency Department complaining of cough, body aches, and sore throat. Symptoms began yesterday. She also reports chills and persistent cough that she describes as nonproductive. She does report recent sick contacts. She did not take a flu shot this year. She reports persistent coughing that Her from sleeping last night. She has not tried any over-the-counter medications. She also admits to a recent UTI, she was prescribed amoxicillin by her urologist but states she only took 2 doses due to nausea that she relates to the antibiotics. She denies shortness of breath, chest pain, vomiting, or dysuria.    Past Medical History  Diagnosis Date  . Hypothyroidism   . GERD (gastroesophageal reflux disease)     refractory   . Arthritis   . Anxiety   . PONV (postoperative nausea and vomiting)   . Spinal headache     pt. states when she had rectocele repair she had a spinal & had a bad headache after  . Hypertension   . Chronic neck pain   . IBS (irritable bowel syndrome)   . Equivocal stress test 2009    NS STTW changes   Past Surgical History  Procedure Laterality Date  . Abdominal hysterectomy  1985  . Cholecystectomy  2009    Dr. Lovell Sheehan  . Rectocele repair    . Tonsillectomy  youth  . Appendectomy  teenager  . Bunoinectomy      left foot  . Dilation and curettage of uterus      x 2  . Cystocele repair N/A 08/16/2012    Procedure: ANTERIOR REPAIR (CYSTOCELE);  Surgeon: Tilda Burrow, MD;  Location: [facility];  Service: Gynecology;  Laterality: N/A;  . Colonoscopy  12/26/2003    ION:GEXBMW rectum/Sessile, infiltrating, ulcerated process adjacent to the  ileocecal valve suspicious for colonic neoplasm, biopsied multiple times/Shallow left-sided diverticula  . Colonoscopy  03/04/2004    UXL:KGMWNU rectum/Shallow left-sided diverticula/Previously noted ulcerated area in the right colon was gone/Aspirin and nonsteroidal anti-inflammatory drugs are well-known to produce stricture, webs, and ulcerations throughout the upper gastrointestinal tract, small bowel, and even proximal colon  . Esophagogastroduodenoscopy  08/16/2007    UVO:ZDGUYQ esophagus, stomach, D1, D2.  . Esophagogastroduodenoscopy N/A 10/16/2013    IHK:VQQVZ HH/Abnormal gastric mucosa of doubtful clinical significance s/p bx   Family History  Problem Relation Age of Onset  . Colon cancer Neg Hx    Social History  Substance Use Topics  . Smoking status: Never Smoker   . Smokeless tobacco: None  . Alcohol Use: 0.6 oz/week    1 Glasses of wine per week     Comment: social, rare wine   OB History    No data available     Review of Systems  Constitutional: Positive for fever and chills. Negative for activity change and appetite change.  HENT: Positive for congestion, rhinorrhea and sore throat. Negative for facial swelling and trouble swallowing.   Eyes: Negative for visual disturbance.  Respiratory: Positive for cough. Negative for shortness of breath, wheezing and stridor.   Cardiovascular: Negative for chest pain.  Gastrointestinal: Negative for nausea, vomiting and abdominal pain.  Genitourinary: Negative for dysuria.  Musculoskeletal: Positive  for myalgias. Negative for neck pain and neck stiffness.  Skin: Negative.   Neurological: Negative for dizziness, weakness, numbness and headaches.  Hematological: Negative for adenopathy.  Psychiatric/Behavioral: Negative for confusion.  All other systems reviewed and are negative.     Allergies  Asa; Demerol; Vicodin; and Tramadol  Home Medications   Prior to Admission medications   Medication Sig Start Date End Date  Taking? Authorizing Provider  aspirin EC 81 MG tablet Take 81 mg by mouth daily.    Historical Provider, MD  bismuth subsalicylate (PEPTO BISMOL) 262 MG/15ML suspension Take 15 mLs by mouth every 6 (six) hours as needed for indigestion.    Historical Provider, MD  estradiol (ESTRACE) 1 MG tablet Take 0.5 mg by mouth every other day.     Historical Provider, MD  hydroxypropyl methylcellulose (ISOPTO TEARS) 2.5 % ophthalmic solution Place 1 drop into both eyes daily as needed for dry eyes.    Historical Provider, MD  levothyroxine (SYNTHROID) 112 MCG tablet Take 112 mcg by mouth daily. Patient has to have Brand Name Drug    Historical Provider, MD  LORazepam (ATIVAN) 0.5 MG tablet Take 0.25 mg by mouth at bedtime.     Historical Provider, MD  RABEprazole (ACIPHEX) 20 MG tablet Take 1 tablet (20 mg total) by mouth daily. 10/02/13   Nira Retort, NP   BP 131/72 mmHg  Pulse 105  Temp(Src) 99.5 F (37.5 C) (Oral)  Resp 18  Ht 5' 2.5" (1.588 m)  Wt 60.782 kg  BMI 24.10 kg/m2  SpO2 100% Physical Exam  Constitutional: She is oriented to person, place, and time. She appears well-developed and well-nourished. No distress.  HENT:  Head: Normocephalic and atraumatic.  Right Ear: Tympanic membrane and ear canal normal.  Left Ear: Tympanic membrane and ear canal normal.  Mouth/Throat: Uvula is midline, oropharynx is clear and moist and mucous membranes are normal. No oropharyngeal exudate.  Eyes: EOM are normal. Pupils are equal, round, and reactive to light.  Neck: Normal range of motion, full passive range of motion without pain and phonation normal. Neck supple.  Cardiovascular: Normal rate, regular rhythm, normal heart sounds and intact distal pulses.   No murmur heard. Pulmonary/Chest: Effort normal. No stridor. No respiratory distress. She has no rales. She exhibits no tenderness.  Actively coughing during exam, lungs are CTA bilaterally  Abdominal: Soft. She exhibits no distension. There is no  tenderness. There is no rebound and no guarding.  Musculoskeletal: She exhibits no edema.  Lymphadenopathy:    She has no cervical adenopathy.  Neurological: She is alert and oriented to person, place, and time. She exhibits normal muscle tone. Coordination normal.  Skin: Skin is warm and dry.  Nursing note and vitals reviewed.   ED Course  Procedures (including critical care time) Labs Review Labs Reviewed  URINALYSIS, ROUTINE W REFLEX MICROSCOPIC (NOT AT [facility]) - Abnormal; Notable for the following:    Ketones, ur TRACE (*)    Leukocytes, UA MODERATE (*)    All other components within normal limits  URINE MICROSCOPIC-ADD ON - Abnormal; Notable for the following:    Squamous Epithelial / LPF TOO NUMEROUS TO COUNT (*)    Bacteria, UA MANY (*)    All other components within normal limits  URINE CULTURE    Imaging Review Dg Chest 2 View  09/09/2015  CLINICAL DATA:  Cough for 2 days EXAM: CHEST  2 VIEW COMPARISON:  August 16, 2013 FINDINGS: There is no edema or consolidation. Heart  size and pulmonary vascularity are normal. No adenopathy. There is degenerative change in the thoracic spine. There are surgical clips in the right upper quadrant of the abdomen. IMPRESSION: No edema or consolidation. Electronically Signed   By: Bretta Bang III M.D.   On: 09/09/2015 09:24   I have personally reviewed and evaluated these images and lab results as part of my medical decision-making.   EKG Interpretation None      MDM   Final diagnoses:  UTI (lower urinary tract infection)  URI (upper respiratory infection)    Patient actively coughing on exam. Lung sounds are clear to auscultation bilaterally. Chest x-ray negative for pneumonia, vitals stable. Patient is currently drinking water without difficulty. Was seen by her urologist last week and treated with amoxicillin for UTI, she only took 2 doses and stopped secondary to nausea. I did advise patient that she continues to have a UTI  and the risk involved with not taking antibiotics as directed. I will prescribe Keflex and urine culture is pending. She states she has a follow-up appointment with her urologist later this month. Return precautions given. Patient appears stable for discharge.  Pt also seen by Dr. Rosalia Hammers and care plan discussed.   Pauline Aus, PA-C 09/09/15 1026  Margarita Grizzle, MD 09/09/15 1547

## 2015-09-09 NOTE — ED Notes (Signed)
PA at bedside.

## 2015-09-09 NOTE — ED Notes (Addendum)
Pt states dry cough began yesterday morning. States unable to sleep last night due to cough. States body aches and chills as well. Also states sore throat. Treated for UTI last week, per pt. Symptoms have improved.

## 2015-09-10 LAB — URINE CULTURE

## 2015-09-24 ENCOUNTER — Ambulatory Visit (INDEPENDENT_AMBULATORY_CARE_PROVIDER_SITE_OTHER): Payer: Medicare Other | Admitting: Urology

## 2015-09-24 DIAGNOSIS — N3941 Urge incontinence: Secondary | ICD-10-CM

## 2015-09-24 DIAGNOSIS — Z87440 Personal history of urinary (tract) infections: Secondary | ICD-10-CM | POA: Diagnosis not present

## 2015-11-10 ENCOUNTER — Ambulatory Visit (INDEPENDENT_AMBULATORY_CARE_PROVIDER_SITE_OTHER): Payer: Medicare Other | Admitting: Urology

## 2015-11-10 DIAGNOSIS — R102 Pelvic and perineal pain: Secondary | ICD-10-CM

## 2015-11-10 DIAGNOSIS — Z87440 Personal history of urinary (tract) infections: Secondary | ICD-10-CM

## 2015-11-10 DIAGNOSIS — R351 Nocturia: Secondary | ICD-10-CM

## 2015-11-24 ENCOUNTER — Ambulatory Visit (INDEPENDENT_AMBULATORY_CARE_PROVIDER_SITE_OTHER): Payer: Medicare Other | Admitting: Obstetrics and Gynecology

## 2015-11-24 ENCOUNTER — Encounter: Payer: Self-pay | Admitting: Obstetrics and Gynecology

## 2015-11-24 VITALS — BP 120/76 | Ht 62.5 in

## 2015-11-24 DIAGNOSIS — IMO0002 Reserved for concepts with insufficient information to code with codable children: Secondary | ICD-10-CM

## 2015-11-24 DIAGNOSIS — N8110 Cystocele, unspecified: Secondary | ICD-10-CM | POA: Diagnosis not present

## 2015-11-24 DIAGNOSIS — N390 Urinary tract infection, site not specified: Secondary | ICD-10-CM

## 2015-11-24 NOTE — Progress Notes (Signed)
Patient ID: Misty [name], female   DOB: 06/26/1941, 75 y.o.   MRN: [id number] Pt here today for her annual exam. Pt states that her family Dr. Does a breast exam every year so she just needs the pelvic exam.

## 2015-11-24 NOTE — Progress Notes (Addendum)
Patient ID: Misty [name], female   DOB: [birth year], 75 y.o.   MRN: [id number]    [facility] Visit  @            Patient name: Misty [name] MRN [id number]  Date of birth: 1940/11/01  CC & HPI:  Misty [name] is a 75 y.o. female presenting today for the sensation of her bladder dropping. She notes associated urinary urgency, with getting up 0-1 times a night, although she reports she currently has a UTI, and also has occasional incomplete voiding. She states her symptoms are the same in the morning and at night. Patient has a history of rectocele repair of unknown date. She also had an anterior repair done by me in 2014. She denies urge incontinence. Patient states she is sexually active occasionally.  ROS:  Review of Systems  Genitourinary: Positive for urgency.       Positive for incomplete voiding. Negative for urge incontinence.  All other systems reviewed and are negative.   Pertinent History Reviewed:   Reviewed: Significant for abdominal hysterectomy, rectocele repair, cystocele repair   Medical         Past Medical History  Diagnosis Date  . Hypothyroidism   . GERD (gastroesophageal reflux disease)     refractory   . Arthritis   . Anxiety   . PONV (postoperative nausea and vomiting)   . Spinal headache     pt. states when she had rectocele repair she had a spinal & had a bad headache after  . Hypertension   . Chronic neck pain   . IBS (irritable bowel syndrome)   . Equivocal stress test 2009    NS STTW changes                              Surgical Hx:    Past Surgical History  Procedure Laterality Date  . Abdominal hysterectomy  1985  . Cholecystectomy  2009    Dr. Lovell Sheehan  . Rectocele repair    . Tonsillectomy  youth  . Appendectomy  teenager  . Bunoinectomy      left foot  . Dilation and curettage of uterus      x 2  . Cystocele repair N/A 08/16/2012    Procedure: ANTERIOR REPAIR (CYSTOCELE);  Surgeon: Tilda Burrow, MD;   Location: [facility];  Service: Gynecology;  Laterality: N/A;  . Colonoscopy  12/26/2003    HKV:QQVZDG rectum/Sessile, infiltrating, ulcerated process adjacent to the ileocecal valve suspicious for colonic neoplasm, biopsied multiple times/Shallow left-sided diverticula  . Colonoscopy  03/04/2004    LOV:FIEPPI rectum/Shallow left-sided diverticula/Previously noted ulcerated area in the right colon was gone/Aspirin and nonsteroidal anti-inflammatory drugs are well-known to produce stricture, webs, and ulcerations throughout the upper gastrointestinal tract, small bowel, and even proximal colon  . Esophagogastroduodenoscopy  08/16/2007    RJJ:OACZYS esophagus, stomach, D1, D2.  . Esophagogastroduodenoscopy N/A 10/16/2013    AYT:KZSWF HH/Abnormal gastric mucosa of doubtful clinical significance s/p bx   Medications: Reviewed & Updated - see associated section                       Current outpatient prescriptions:  .  aspirin EC 81 MG tablet, Take 81 mg by mouth daily as needed for mild pain. , Disp: , Rfl:  .  bismuth subsalicylate (PEPTO BISMOL) 262 MG/15ML suspension, Take 15 mLs by mouth every 6 (six) hours  as needed for indigestion., Disp: , Rfl:  .  ketotifen (ZADITOR) 0.025 % ophthalmic solution, Place 1 drop into both eyes 2 (two) times daily as needed (dry eyes)., Disp: , Rfl:  .  levothyroxine (SYNTHROID) 112 MCG tablet, Take 112 mcg by mouth daily. Patient has to have Brand Name Drug, Disp: , Rfl:  .  LORazepam (ATIVAN) 0.5 MG tablet, Take 0.25 mg by mouth at bedtime. , Disp: , Rfl:    Social History: Reviewed -  reports that she has never smoked. She has never used smokeless tobacco.  Objective Findings:  Vitals: Blood pressure 120/76, height 5' 2.5" (1.588 m).  Physical Examination: General appearance - alert, well appearing, and in no distress, oriented to person, place, and time and normal appearing weight Mental status - alert, oriented to person, place, and time, normal mood,  behavior, speech, dress, motor activity, and thought processes, affect appropriate to mood Pelvic - normal external genitalia  VAGINA: Some descent of the vaginal apex, and descent of the upper third of the anterior vagina wall. No urine loss with coughing in the supine position. The small cylindrical descent of the anterior vaginal wall apex and underlying bladder descend to the introitus. RECTAL: Posterior support good, no clinical rectocele   Assessment & Plan:   A:  1. Cystocele. recurrent 2. Hx of anterior repair in 2014.  P:   1. Recommended OTC Poise Impressa bladder supports.  2. Follow-up in 6 weeks for re-evaluation. Will do ultrasound at that time. If not better, will consider surgical repair.    By signing my name below, I, Ronney LionSuzanne Le, attest that this documentation has been prepared under the direction and in the presence of Tilda BurrowJohn Oluwaseun Cremer V, MD. Electronically Signed: Ronney LionSuzanne Le, ED Scribe. 11/24/2015. 9:47 AM.  I personally performed the services described in this documentation, which was SCRIBED in my presence. The recorded information has been reviewed and considered accurate. It has been edited as necessary during review. Tilda BurrowFERGUSON,Naoma Boxell V, MD

## 2015-11-28 ENCOUNTER — Emergency Department (HOSPITAL_COMMUNITY)
Admission: EM | Admit: 2015-11-28 | Discharge: 2015-11-28 | Disposition: A | Discharge status: Home / Self Care | Payer: Medicare Other | Attending: Emergency Medicine | Admitting: Emergency Medicine

## 2015-11-28 ENCOUNTER — Encounter (HOSPITAL_COMMUNITY): Payer: Self-pay | Admitting: Emergency Medicine

## 2015-11-28 DIAGNOSIS — Z7982 Long term (current) use of aspirin: Secondary | ICD-10-CM | POA: Insufficient documentation

## 2015-11-28 DIAGNOSIS — N390 Urinary tract infection, site not specified: Secondary | ICD-10-CM | POA: Insufficient documentation

## 2015-11-28 DIAGNOSIS — M1990 Unspecified osteoarthritis, unspecified site: Secondary | ICD-10-CM | POA: Diagnosis not present

## 2015-11-28 DIAGNOSIS — R350 Frequency of micturition: Secondary | ICD-10-CM | POA: Diagnosis present

## 2015-11-28 DIAGNOSIS — I10 Essential (primary) hypertension: Secondary | ICD-10-CM | POA: Diagnosis not present

## 2015-11-28 DIAGNOSIS — E039 Hypothyroidism, unspecified: Secondary | ICD-10-CM | POA: Diagnosis not present

## 2015-11-28 LAB — URINALYSIS, ROUTINE W REFLEX MICROSCOPIC
BILIRUBIN URINE: NEGATIVE
GLUCOSE, UA: NEGATIVE mg/dL
HGB URINE DIPSTICK: NEGATIVE
Ketones, ur: NEGATIVE mg/dL
Nitrite: NEGATIVE
Protein, ur: NEGATIVE mg/dL
SPECIFIC GRAVITY, URINE: 1.015 (ref 1.005–1.030)
pH: 5.5 (ref 5.0–8.0)

## 2015-11-28 LAB — URINE MICROSCOPIC-ADD ON

## 2015-11-28 MED ORDER — CEPHALEXIN 500 MG PO CAPS
500.0000 mg | ORAL_CAPSULE | Freq: Once | ORAL | Status: AC
  Administered 2015-11-28: 500 mg via ORAL
  Filled 2015-11-28: qty 1

## 2015-11-28 MED ORDER — CEPHALEXIN 500 MG PO CAPS: 500.0000 mg | ORAL_CAPSULE | Freq: Four times a day (QID) | ORAL | Status: DC

## 2015-11-28 NOTE — ED Provider Notes (Signed)
  Face-to-face evaluation   History: Urinary frequency for a few days. Recent evaluation for uterine prolapse.  Physical exam: Alert, elderly female who is comfortable. No respiratory distress. She is lucid.  Medical screening examination/treatment/procedure(s) were conducted as a shared visit with non-physician practitioner(s) and myself.  I personally evaluated the patient during the encounter   Mancel BaleElliott Tacia Hindley, MD 11/28/15 1624

## 2015-11-28 NOTE — ED Provider Notes (Signed)
CSN: [id number]     Arrival date & time 11/28/15  4098 History   First MD Initiated Contact with Patient 11/28/15 [phone]     Chief Complaint  Patient presents with  . Urinary Frequency     (Consider location/radiation/quality/duration/timing/severity/associated sxs/prior Treatment) HPI   Misty [name] is a 75 y.o. female who presents to the Emergency Department complaining of urinary frequency and intermittent burning with urination for 2 months and increased for 2 days. She states that she was seen here in March for similar symptoms that improved after taking Keflex. She states that she was seen by her gynecologist recently and told that her uterus has "dropped."  She state that her urine was tested at that time and was clear of infection.  She describes increased urgency to urinate with voiding only small amounts. She denies stress incontinence, fever, chills, flank pain, abdominal pain and hematuria.  Past Medical History  Diagnosis Date  . Hypothyroidism   . GERD (gastroesophageal reflux disease)     refractory   . Arthritis   . Anxiety   . PONV (postoperative nausea and vomiting)   . Spinal headache     pt. states when she had rectocele repair she had a spinal & had a bad headache after  . Hypertension   . Chronic neck pain   . IBS (irritable bowel syndrome)   . Equivocal stress test 2009    NS STTW changes   Past Surgical History  Procedure Laterality Date  . Abdominal hysterectomy  1985  . Cholecystectomy  2009    Dr. Lovell Sheehan  . Rectocele repair    . Tonsillectomy  youth  . Appendectomy  teenager  . Bunoinectomy      left foot  . Dilation and curettage of uterus      x 2  . Cystocele repair N/A 08/16/2012    Procedure: ANTERIOR REPAIR (CYSTOCELE);  Surgeon: Tilda Burrow, MD;  Location: [facility];  Service: Gynecology;  Laterality: N/A;  . Colonoscopy  12/26/2003    YNW:GNFAOZ rectum/Sessile, infiltrating, ulcerated process adjacent to the ileocecal valve  suspicious for colonic neoplasm, biopsied multiple times/Shallow left-sided diverticula  . Colonoscopy  03/04/2004    HYQ:MVHQIO rectum/Shallow left-sided diverticula/Previously noted ulcerated area in the right colon was gone/Aspirin and nonsteroidal anti-inflammatory drugs are well-known to produce stricture, webs, and ulcerations throughout the upper gastrointestinal tract, small bowel, and even proximal colon  . Esophagogastroduodenoscopy  08/16/2007    NGE:XBMWUX esophagus, stomach, D1, D2.  . Esophagogastroduodenoscopy N/A 10/16/2013    LKG:MWNUU HH/Abnormal gastric mucosa of doubtful clinical significance s/p bx   Family History  Problem Relation Age of Onset  . Colon cancer Neg Hx   . Hypertension Mother   . Cancer Father     lung  . Hypertension Son    Social History  Substance Use Topics  . Smoking status: Never Smoker   . Smokeless tobacco: Never Used  . Alcohol Use: 0.6 oz/week    1 Glasses of wine per week     Comment: social, rare wine   OB History    No data available     Review of Systems  Constitutional: Negative for fever, chills and appetite change.  Respiratory: Negative for shortness of breath.   Cardiovascular: Negative for chest pain.  Gastrointestinal: Negative for nausea, vomiting and abdominal pain.  Genitourinary: Positive for dysuria, urgency and frequency. Negative for hematuria, flank pain, vaginal bleeding, vaginal discharge, difficulty urinating and vaginal pain.  Musculoskeletal: Negative for neck  pain.  Skin: Negative for rash.  Neurological: Negative for dizziness and weakness.  All other systems reviewed and are negative.     Allergies  Amoxicillin; Asa; Demerol; Vicodin; and Tramadol  Home Medications   Prior to Admission medications   Medication Sig Start Date End Date Taking? Authorizing Provider  aspirin EC 81 MG tablet Take 81 mg by mouth daily as needed for mild pain.     Historical Provider, MD  bismuth subsalicylate (PEPTO  BISMOL) 262 MG/15ML suspension Take 15 mLs by mouth every 6 (six) hours as needed for indigestion.    Historical Provider, MD  ketotifen (ZADITOR) 0.025 % ophthalmic solution Place 1 drop into both eyes 2 (two) times daily as needed (dry eyes).    Historical Provider, MD  levothyroxine (SYNTHROID) 112 MCG tablet Take 112 mcg by mouth daily. Patient has to have Brand Name Drug    Historical Provider, MD  LORazepam (ATIVAN) 0.5 MG tablet Take 0.25 mg by mouth at bedtime.     Historical Provider, MD   BP 131/69 mmHg  Pulse 84  Temp(Src) 97.8 F (36.6 C)  Resp 18  Ht 5\' 2"  (1.575 m)  Wt 58.968 kg  BMI 23.77 kg/m2  SpO2 100% Physical Exam  Constitutional: She is oriented to person, place, and time. She appears well-developed and well-nourished. No distress.  HENT:  Head: Normocephalic and atraumatic.  Mouth/Throat: Oropharynx is clear and moist.  Cardiovascular: Normal rate, regular rhythm, normal heart sounds and intact distal pulses.   No murmur heard. Pulmonary/Chest: Effort normal and breath sounds normal. No respiratory distress. She has no wheezes. She has no rales.  Abdominal: Soft. Normal appearance. She exhibits no distension and no mass. There is no hepatosplenomegaly. There is no tenderness. There is no rigidity, no rebound, no guarding, no CVA tenderness and no tenderness at McBurney's point.  Musculoskeletal: Normal range of motion. She exhibits no edema.  Neurological: She is alert and oriented to person, place, and time. Coordination normal.  Skin: Skin is warm and dry. No rash noted.  Psychiatric: She has a normal mood and affect.  Nursing note and vitals reviewed.   ED Course  Procedures (including critical care time) Labs Review Labs Reviewed  URINALYSIS, ROUTINE W REFLEX MICROSCOPIC (NOT AT [facility]) - Abnormal; Notable for the following:    Leukocytes, UA LARGE (*)    All other components within normal limits  URINE MICROSCOPIC-ADD ON - Abnormal; Notable for the  following:    Squamous Epithelial / LPF 6-30 (*)    Bacteria, UA FEW (*)    All other components within normal limits  URINE CULTURE    Imaging Review No results found. I have personally reviewed and evaluated these images and lab results as part of my medical decision-making.   EKG Interpretation None      MDM   Final diagnoses:  Urinary tract infection without complication    Patient is well-appearing, nontoxic. Vital signs are stable. No concerning symptoms for pyelonephritis.  Patient seen here in March for similar symptoms, was treated with Keflex with resolution. Urine culture at that time did not show significant growth. Patient appears stable for discharge at this time, I will prescribe Keflex again and urine culture has been ordered. Advised her that she needs to follow up with her urologist for recheck since this is a recurring issue for her.  Patient also seen by Dr. Effie ShyWentz and care plan discussed.  Pauline Ausammy Chanze Teagle, PA-C 11/28/15 0901  Mancel BaleElliott Wentz, MD 11/28/15 [phone]

## 2015-11-28 NOTE — ED Notes (Signed)
Pt states she has been having burning with urination as well as frequency for the past few months.  States she will be tx with abx and the symptoms will resolve, but will return shortly after completion of medications.

## 2015-11-28 NOTE — Discharge Instructions (Signed)

## 2015-11-29 LAB — URINE CULTURE

## 2015-11-30 ENCOUNTER — Other Ambulatory Visit: Payer: Self-pay | Admitting: Urology

## 2015-11-30 ENCOUNTER — Ambulatory Visit (HOSPITAL_COMMUNITY)
Admission: RE | Admit: 2015-11-30 | Discharge: 2015-11-30 | Disposition: A | Discharge status: Home / Self Care | Payer: Medicare Other | Source: Ambulatory Visit | Attending: Urology | Admitting: Urology

## 2015-11-30 ENCOUNTER — Ambulatory Visit (INDEPENDENT_AMBULATORY_CARE_PROVIDER_SITE_OTHER): Payer: Medicare Other | Admitting: Urology

## 2015-11-30 DIAGNOSIS — N390 Urinary tract infection, site not specified: Secondary | ICD-10-CM

## 2015-11-30 DIAGNOSIS — N952 Postmenopausal atrophic vaginitis: Secondary | ICD-10-CM | POA: Diagnosis not present

## 2015-11-30 DIAGNOSIS — N9489 Other specified conditions associated with female genital organs and menstrual cycle: Secondary | ICD-10-CM | POA: Insufficient documentation

## 2015-12-08 ENCOUNTER — Encounter: Payer: Self-pay | Admitting: Obstetrics and Gynecology

## 2015-12-08 ENCOUNTER — Ambulatory Visit (INDEPENDENT_AMBULATORY_CARE_PROVIDER_SITE_OTHER): Payer: Medicare Other | Admitting: Obstetrics and Gynecology

## 2015-12-08 VITALS — BP 110/60 | Ht 62.0 in | Wt 137.0 lb

## 2015-12-08 DIAGNOSIS — R102 Pelvic and perineal pain: Secondary | ICD-10-CM

## 2015-12-08 DIAGNOSIS — IMO0002 Reserved for concepts with insufficient information to code with codable children: Secondary | ICD-10-CM

## 2015-12-08 DIAGNOSIS — N8110 Cystocele, unspecified: Secondary | ICD-10-CM | POA: Diagnosis not present

## 2015-12-08 DIAGNOSIS — R3915 Urgency of urination: Secondary | ICD-10-CM

## 2015-12-08 DIAGNOSIS — N390 Urinary tract infection, site not specified: Secondary | ICD-10-CM

## 2015-12-08 DIAGNOSIS — R350 Frequency of micturition: Secondary | ICD-10-CM

## 2015-12-08 MED ORDER — CEPHALEXIN 500 MG PO CAPS: 500.0000 mg | ORAL_CAPSULE | Freq: Four times a day (QID) | ORAL | Status: DC

## 2015-12-08 NOTE — Progress Notes (Signed)
Patient ID: Misty [name], female   DOB: 02/26/1941, 75 y.o.   MRN: [id number]    [facility]  @DATE @            Patient name: Misty [name] MRN [id number]  Date of birth: 07/15/1940  CC & HPI:  Misty [name] is a 75 y.o. female presenting today for urinary frequency, urgency and dysuria. She also complains of the sensation of incomplete voiding and pelvic pressure. She was seen in the office on 11/24/15 complaining of similar symptoms and then seen in the ED on 11/28/15, dx with UTI and rx Keflex. She reports her symptoms slightly improved after this treatment; however she has not seen significant relief. On f/u with urology on 11/30/15, renal US showed 30 cc PVR with no other significant findings. Per pt, her incomplete voiding is worse during the day and she feels that she is able to void completely during the night. Pt states she did not see relief with Poise impressa bladder supports as suggested at her last office visit. Per pt, she has not experienced UTI symptoms around times of sexual intercourse. Pt has had multiple cystocele repairs. She denies fever, chills, additional symptoms.   ROS:  Review of Systems  Constitutional: Negative for fever and chills.  Genitourinary: Positive for dysuria, urgency and frequency.       +incomplete voiding and pelvic pressure    Pertinent History Reviewed:   Reviewed: Significant for cystocele repair 08/16/2012, abdominal hysterectomy  Medical         Past Medical History  Diagnosis Date  . Hypothyroidism   . GERD (gastroesophageal reflux disease)     refractory   . Arthritis   . Anxiety   . PONV (postoperative nausea and vomiting)   . Spinal headache     pt. states when she had rectocele repair she had a spinal & had a bad headache after  . Hypertension   . Chronic neck pain   . IBS (irritable bowel syndrome)   . Equivocal stress test 2009    NS STTW changes                              Surgical Hx:    Past  Surgical History  Procedure Laterality Date  . Abdominal hysterectomy  1985  . Cholecystectomy  2009    Dr. Lovell SheehanJenkins  . Rectocele repair    . Tonsillectomy  youth  . Appendectomy  teenager  . Bunoinectomy      left foot  . Dilation and curettage of uterus      x 2  . Cystocele repair N/A 08/16/2012    Procedure: ANTERIOR REPAIR (CYSTOCELE);  Surgeon: Tilda BurrowJohn V Chani Ghanem, MD;  Location: [facility];  Service: Gynecology;  Laterality: N/A;  . Colonoscopy  12/26/2003    NWG:NFAOZHRMR:Normal rectum/Sessile, infiltrating, ulcerated process adjacent to the ileocecal valve suspicious for colonic neoplasm, biopsied multiple times/Shallow left-sided diverticula  . Colonoscopy  03/04/2004    YQM:VHQIONRMR:Normal rectum/Shallow left-sided diverticula/Previously noted ulcerated area in the right colon was gone/Aspirin and nonsteroidal anti-inflammatory drugs are well-known to produce stricture, webs, and ulcerations throughout the upper gastrointestinal tract, small bowel, and even proximal colon  . Esophagogastroduodenoscopy  08/16/2007    GEX:BMWUXLRMR:Normal esophagus, stomach, D1, D2.  . Esophagogastroduodenoscopy N/A 10/16/2013    KGM:WNUUVRMR:small HH/Abnormal gastric mucosa of doubtful clinical significance s/p bx   Medications: Reviewed & Updated - see associated section  Current outpatient prescriptions:  .  aspirin EC 81 MG tablet, Take 81 mg by mouth daily as needed for mild pain. , Disp: , Rfl:  .  bismuth subsalicylate (PEPTO BISMOL) 262 MG/15ML suspension, Take 15 mLs by mouth every 6 (six) hours as needed for indigestion., Disp: , Rfl:  .  ketotifen (ZADITOR) 0.025 % ophthalmic solution, Place 1 drop into both eyes 2 (two) times daily as needed (dry eyes)., Disp: , Rfl:  .  levothyroxine (SYNTHROID) 112 MCG tablet, Take 112 mcg by mouth daily. Patient has to have Brand Name Drug, Disp: , Rfl:  .  LORazepam (ATIVAN) 0.5 MG tablet, Take 0.25 mg by mouth at bedtime. , Disp: , Rfl:    Social History: Reviewed -   reports that she has never smoked. She has never used smokeless tobacco.  Objective Findings:  Vitals: Blood pressure 110/60, height  (1.575 m), weight 137 lb (62.143 kg).  Physical Examination: General appearance - alert, well appearing, and in no distress Abdomen - soft, nontender, nondistended, no masses or organomegaly Pelvic - VULVA: normal appearing vulva with no masses, tenderness or lesions,  VAGINA: normal appearing vagina with normal color and discharge, no lesions, adequate support  Musculoskeletal - no joint tenderness, deformity or swelling Extremities - peripheral pulses normal, no pedal edema, no clubbing or cyanosis Skin - normal coloration and turgor, no rashes, no suspicious skin lesions noted  I spent over 25 minutes with the visit with >than 50% spent in counseling and coordination of care.   Assessment & Plan:   A:  1. Recurrent bladder infections  2. S/p anterior repair in 2014  3. Renal US on 11/30/15 showed 30 cc PVR (minimal)  P:  1. Refill Keflex prn for upcoming travel 2. F/u prn change in sx     By signing my name below, I, Doreatha Martin, attest that this documentation has been prepared under the direction and in the presence of Tilda Burrow, MD. Electronically Signed: Doreatha Martin, ED Scribe. 12/08/2015. 1:47 PM.  I personally performed the services described in this documentation, which was SCRIBED in my presence. The recorded information has been reviewed and considered accurate. It has been edited as necessary during review. Tilda Burrow, MD

## 2015-12-10 ENCOUNTER — Ambulatory Visit: Payer: Medicare Other | Admitting: Urology

## 2015-12-31 ENCOUNTER — Ambulatory Visit (INDEPENDENT_AMBULATORY_CARE_PROVIDER_SITE_OTHER): Payer: Medicare Other | Admitting: Urology

## 2015-12-31 ENCOUNTER — Other Ambulatory Visit (HOSPITAL_COMMUNITY)
Admission: RE | Admit: 2015-12-31 | Discharge: 2015-12-31 | Disposition: A | Discharge status: Home / Self Care | Payer: Medicare Other | Source: Other Acute Inpatient Hospital | Attending: Urology | Admitting: Urology

## 2015-12-31 DIAGNOSIS — N952 Postmenopausal atrophic vaginitis: Secondary | ICD-10-CM

## 2015-12-31 DIAGNOSIS — N8111 Cystocele, midline: Secondary | ICD-10-CM | POA: Diagnosis not present

## 2015-12-31 DIAGNOSIS — N390 Urinary tract infection, site not specified: Secondary | ICD-10-CM | POA: Insufficient documentation

## 2015-12-31 LAB — URINALYSIS, ROUTINE W REFLEX MICROSCOPIC
Bilirubin Urine: NEGATIVE
GLUCOSE, UA: NEGATIVE mg/dL
HGB URINE DIPSTICK: NEGATIVE
KETONES UR: NEGATIVE mg/dL
Nitrite: NEGATIVE
PROTEIN: NEGATIVE mg/dL
Specific Gravity, Urine: >1.03 — ABNORMAL HIGH (ref 1.005–1.030)
pH: 5 (ref 5.0–8.0)

## 2015-12-31 LAB — URINE MICROSCOPIC-ADD ON: RBC / HPF: NONE SEEN RBC/hpf (ref 0–5)

## 2016-01-10 ENCOUNTER — Telehealth: Payer: Self-pay | Admitting: *Deleted

## 2016-01-12 ENCOUNTER — Encounter: Payer: Self-pay | Admitting: Obstetrics and Gynecology

## 2016-01-12 ENCOUNTER — Telehealth: Payer: Self-pay | Admitting: *Deleted

## 2016-01-12 ENCOUNTER — Ambulatory Visit (INDEPENDENT_AMBULATORY_CARE_PROVIDER_SITE_OTHER): Payer: Medicare Other | Admitting: Obstetrics and Gynecology

## 2016-01-12 VITALS — BP 100/74 | Ht 62.5 in

## 2016-01-12 DIAGNOSIS — N3941 Urge incontinence: Secondary | ICD-10-CM | POA: Diagnosis not present

## 2016-01-12 DIAGNOSIS — N8111 Cystocele, midline: Secondary | ICD-10-CM | POA: Diagnosis not present

## 2016-01-12 DIAGNOSIS — R399 Unspecified symptoms and signs involving the genitourinary system: Secondary | ICD-10-CM | POA: Diagnosis not present

## 2016-01-12 MED ORDER — NITROFURANTOIN MACROCRYSTAL 100 MG PO CAPS: 100.0000 mg | ORAL_CAPSULE | Freq: Every day | ORAL | Status: DC

## 2016-01-12 NOTE — Progress Notes (Signed)
Patient ID: Misty [name], female   DOB: 06/13/1941, 75 y.o.   MRN: [id number]   [facility]  @DATE @            Patient name: Misty Princeatricia L Constantine MRN [id number]  Date of birth: 03/12/1941  CC & HPI:  Misty Princeatricia L Cazier is a 75 y.o. female presenting today for Follow-up of urinary tract infections and small recurrent cystocele she was seen by the urologist recently and "just talk to. Urine culture at the office was negative  ROS:  ROS   Pertinent History Reviewed:   Reviewed: Significant for  Medical         Past Medical History  Diagnosis Date  . Hypothyroidism   . GERD (gastroesophageal reflux disease)     refractory   . Arthritis   . Anxiety   . PONV (postoperative nausea and vomiting)   . Spinal headache     pt. states when she had rectocele repair she had a spinal & had a bad headache after  . Hypertension   . Chronic neck pain   . IBS (irritable bowel syndrome)   . Equivocal stress test 2009    NS STTW changes                              Surgical Hx:    Past Surgical History  Procedure Laterality Date  . Abdominal hysterectomy  1985  . Cholecystectomy  2009    Dr. Lovell SheehanJenkins  . Rectocele repair    . Tonsillectomy  youth  . Appendectomy  teenager  . Bunoinectomy      left foot  . Dilation and curettage of uterus      x 2  . Cystocele repair N/A 08/16/2012    Procedure: ANTERIOR REPAIR (CYSTOCELE);  Surgeon: Tilda BurrowJohn V Velvie Thomaston, MD;  Location: [facility];  Service: Gynecology;  Laterality: N/A;  . Colonoscopy  12/26/2003    QMV:HQIONGRMR:Normal rectum/Sessile, infiltrating, ulcerated process adjacent to the ileocecal valve suspicious for colonic neoplasm, biopsied multiple times/Shallow left-sided diverticula  . Colonoscopy  03/04/2004    EXB:MWUXLKRMR:Normal rectum/Shallow left-sided diverticula/Previously noted ulcerated area in the right colon was gone/Aspirin and nonsteroidal anti-inflammatory drugs are well-known to produce stricture, webs, and ulcerations throughout the  upper gastrointestinal tract, small bowel, and even proximal colon  . Esophagogastroduodenoscopy  08/16/2007    GMW:NUUVOZRMR:Normal esophagus, stomach, D1, D2.  . Esophagogastroduodenoscopy N/A 10/16/2013    DGU:YQIHKRMR:small HH/Abnormal gastric mucosa of doubtful clinical significance s/p bx   Medications: Reviewed & Updated - see associated section                       Current outpatient prescriptions:  .  aspirin EC 81 MG tablet, Take 81 mg by mouth daily as needed for mild pain. , Disp: , Rfl:  .  bismuth subsalicylate (PEPTO BISMOL) 262 MG/15ML suspension, Take 15 mLs by mouth every 6 (six) hours as needed for indigestion., Disp: , Rfl:  .  ketotifen (ZADITOR) 0.025 % ophthalmic solution, Place 1 drop into both eyes 2 (two) times daily as needed (dry eyes)., Disp: , Rfl:  .  levothyroxine (SYNTHROID) 112 MCG tablet, Take 112 mcg by mouth daily. Patient has to have Brand Name Drug, Disp: , Rfl:  .  LORazepam (ATIVAN) 0.5 MG tablet, Take 0.25 mg by mouth at bedtime. , Disp: , Rfl:  .  cephALEXin (KEFLEX) 500 MG capsule, Take 1 capsule (  500 mg total) by mouth 4 (four) times daily. For 7 days (Patient not taking: Reported on 01/12/2016), Disp: 28 capsule, Rfl: 0   Social History: Reviewed -  reports that she has never smoked. She has never used smokeless tobacco.  Objective Findings:  Vitals: Blood pressure 100/74, height 5' 2.5" (1.588 m).  Physical Examination: General appearance - alert, well appearing, and in no distress, oriented to person, place, and time and normal appearing weight Abdomen - soft, nontender, nondistended, no masses or organomegaly tenderness noted none  Pelvic - VULVA: normal appearing vulva with no masses, tenderness or lesions, VAGINA: normal appearing vagina with normal color and discharge, no lesions, PELVIC FLOOR EXAM: no , rectocele or prolapse noted, cystocele is very small bulges just the introitus about 1 cm to 1.5 cm wide and has no rotation of the urethra accompanying it,  CERVIX: normal appearing cervix without discharge or lesions, surgically absent, UTERUS: surgically absent, vaginal cuff well healed, no palpable internal organs   Assessment & Plan:   A:  1. Small cystocele 2. Single episode of urge incontinence this morning 3. Negative urine culture recently  P:  1. Plan will be to use suppressive Macrodantin 100 mg at bedtime for 3 months and reassess 2. At this point we're not inclined to try to fix the small recurrent cystocele unless recurrent UTIs cannot be prevented with medical treatment

## 2016-01-17 ENCOUNTER — Telehealth: Payer: Self-pay | Admitting: Obstetrics and Gynecology

## 2016-01-17 ENCOUNTER — Encounter (HOSPITAL_COMMUNITY): Payer: Self-pay | Admitting: Emergency Medicine

## 2016-01-17 ENCOUNTER — Emergency Department (HOSPITAL_COMMUNITY)
Admission: EM | Admit: 2016-01-17 | Discharge: 2016-01-17 | Disposition: A | Discharge status: Home / Self Care | Payer: Medicare Other | Attending: Emergency Medicine | Admitting: Emergency Medicine

## 2016-01-17 DIAGNOSIS — E039 Hypothyroidism, unspecified: Secondary | ICD-10-CM | POA: Diagnosis not present

## 2016-01-17 DIAGNOSIS — R300 Dysuria: Secondary | ICD-10-CM | POA: Insufficient documentation

## 2016-01-17 DIAGNOSIS — N390 Urinary tract infection, site not specified: Secondary | ICD-10-CM | POA: Diagnosis not present

## 2016-01-17 DIAGNOSIS — I10 Essential (primary) hypertension: Secondary | ICD-10-CM | POA: Diagnosis not present

## 2016-01-17 DIAGNOSIS — Z7982 Long term (current) use of aspirin: Secondary | ICD-10-CM | POA: Insufficient documentation

## 2016-01-17 DIAGNOSIS — Z79899 Other long term (current) drug therapy: Secondary | ICD-10-CM | POA: Insufficient documentation

## 2016-01-17 DIAGNOSIS — R109 Unspecified abdominal pain: Secondary | ICD-10-CM | POA: Diagnosis not present

## 2016-01-17 DIAGNOSIS — M549 Dorsalgia, unspecified: Secondary | ICD-10-CM | POA: Diagnosis not present

## 2016-01-17 DIAGNOSIS — M1990 Unspecified osteoarthritis, unspecified site: Secondary | ICD-10-CM | POA: Insufficient documentation

## 2016-01-17 LAB — URINALYSIS, ROUTINE W REFLEX MICROSCOPIC
BILIRUBIN URINE: NEGATIVE
GLUCOSE, UA: NEGATIVE mg/dL
Hgb urine dipstick: NEGATIVE
KETONES UR: NEGATIVE mg/dL
Nitrite: NEGATIVE
PH: 5.5 (ref 5.0–8.0)
Protein, ur: NEGATIVE mg/dL
Specific Gravity, Urine: 1.01 (ref 1.005–1.030)

## 2016-01-17 LAB — URINE MICROSCOPIC-ADD ON

## 2016-01-17 MED ORDER — CEPHALEXIN 500 MG PO CAPS: 500.0000 mg | ORAL_CAPSULE | Freq: Four times a day (QID) | ORAL | Status: DC

## 2016-01-17 MED ORDER — CIPROFLOXACIN HCL 500 MG PO TABS: 500.0000 mg | ORAL_TABLET | Freq: Two times a day (BID) | ORAL | Status: DC

## 2016-01-17 NOTE — Discharge Instructions (Signed)
Follow up with your md next week for recheck °

## 2016-01-17 NOTE — Telephone Encounter (Signed)
Left message x 1. JSY 

## 2016-01-17 NOTE — ED Provider Notes (Addendum)
CSN: [id number]     Arrival date & time 01/17/16  1247 History  By signing my name below, I, Jasmyn B. Alexander, attest that this documentation has been prepared under the direction and in the presence of Bethann BerkshireJoseph Maximina Pirozzi, MD.  Electronically Signed: Gillis EndsJasmyn B. Lyn HollingsheadAlexander, ED Scribe. 01/17/2016. 1:21 PM.   Chief Complaint  Patient presents with  . Recurrent UTI   Patient is a 75 y.o. female presenting with dysuria. The history is provided by the patient (She complains of dysuria). No language interpreter was used.  Dysuria Pain quality:  Aching Pain severity:  Mild Onset quality:  Sudden Timing:  Constant Progression:  Worsening Chronicity:  Recurrent Recent urinary tract infections: yes   Associated symptoms: abdominal pain   Associated symptoms: no fever     HPI Comments: Misty [name] is a 75 y.o. female who presents to the Emergency Department complaining of constant urinary frequency, urgency, and dysuria x 3 days. Pt has history of recurrent UTI, with last one being in May of 2017. Pt also has associated RLQ abdominal pain which radiates to lower back. There are no modifying factors. PCP is Dr. Ouida SillsFagan. Denies any fever, chills, diaphoresis.   Past Medical History  Diagnosis Date  . Hypothyroidism   . GERD (gastroesophageal reflux disease)     refractory   . Arthritis   . Anxiety   . PONV (postoperative nausea and vomiting)   . Spinal headache     pt. states when she had rectocele repair she had a spinal & had a bad headache after  . Hypertension   . Chronic neck pain   . IBS (irritable bowel syndrome)   . Equivocal stress test 2009    NS STTW changes   Past Surgical History  Procedure Laterality Date  . Abdominal hysterectomy  1985  . Cholecystectomy  2009    Dr. Lovell SheehanJenkins  . Rectocele repair    . Tonsillectomy  youth  . Appendectomy  teenager  . Bunoinectomy      left foot  . Dilation and curettage of uterus      x 2  . Cystocele repair N/A 08/16/2012     Procedure: ANTERIOR REPAIR (CYSTOCELE);  Surgeon: Tilda BurrowJohn V Ferguson, MD;  Location: [facility];  Service: Gynecology;  Laterality: N/A;  . Colonoscopy  12/26/2003    NWG:NFAOZHRMR:Normal rectum/Sessile, infiltrating, ulcerated process adjacent to the ileocecal valve suspicious for colonic neoplasm, biopsied multiple times/Shallow left-sided diverticula  . Colonoscopy  03/04/2004    YQM:VHQIONRMR:Normal rectum/Shallow left-sided diverticula/Previously noted ulcerated area in the right colon was gone/Aspirin and nonsteroidal anti-inflammatory drugs are well-known to produce stricture, webs, and ulcerations throughout the upper gastrointestinal tract, small bowel, and even proximal colon  . Esophagogastroduodenoscopy  08/16/2007    GEX:BMWUXLRMR:Normal esophagus, stomach, D1, D2.  . Esophagogastroduodenoscopy N/A 10/16/2013    KGM:WNUUVRMR:small HH/Abnormal gastric mucosa of doubtful clinical significance s/p bx   Family History  Problem Relation Age of Onset  . Colon cancer Neg Hx   . Hypertension Mother   . Cancer Father     lung  . Hypertension Son    Social History  Substance Use Topics  . Smoking status: Never Smoker   . Smokeless tobacco: Never Used  . Alcohol Use: 0.6 oz/week    1 Glasses of wine per week     Comment: social, rare wine   OB History    No data available     Review of Systems  Constitutional: Negative for fever, chills and diaphoresis.  Gastrointestinal: Positive for abdominal pain.  Genitourinary: Positive for dysuria, urgency and frequency.  Musculoskeletal: Positive for back pain.  All other systems reviewed and are negative.     Allergies  Amoxicillin; Asa; Demerol; Vicodin; and Tramadol  Home Medications   Prior to Admission medications   Medication Sig Start Date End Date Taking? Authorizing Provider  aspirin EC 81 MG tablet Take 81 mg by mouth daily as needed for mild pain.     Historical Provider, MD  bismuth subsalicylate (PEPTO BISMOL) 262 MG/15ML suspension Take 15 mLs by mouth every 6  (six) hours as needed for indigestion.    Historical Provider, MD  cephALEXin (KEFLEX) 500 MG capsule Take 1 capsule (500 mg total) by mouth 4 (four) times daily. For 7 days Patient not taking: Reported on 01/12/2016 12/08/15   Tilda Burrow, MD  ketotifen (ZADITOR) 0.025 % ophthalmic solution Place 1 drop into both eyes 2 (two) times daily as needed (dry eyes).    Historical Provider, MD  levothyroxine (SYNTHROID) 112 MCG tablet Take 112 mcg by mouth daily. Patient has to have Brand Name Drug    Historical Provider, MD  LORazepam (ATIVAN) 0.5 MG tablet Take 0.25 mg by mouth at bedtime.     Historical Provider, MD  nitrofurantoin (MACRODANTIN) 100 MG capsule Take 1 capsule (100 mg total) by mouth at bedtime. 01/12/16   Tilda Burrow, MD   BP 133/60 mmHg  Pulse 79  Temp(Src) 98.6 F (37 C) (Oral)  Resp 16  Ht  (1.575 m)  Wt 137 lb (62.143 kg)  BMI 25.05 kg/m2  SpO2 100% Physical Exam  Constitutional: She is oriented to person, place, and time. She appears well-developed.  HENT:  Head: Normocephalic.  Eyes: Conjunctivae and EOM are normal. No scleral icterus.  Neck: Neck supple. No thyromegaly present.  Cardiovascular: Normal rate and regular rhythm.  Exam reveals no gallop and no friction rub.   No murmur heard. Pulmonary/Chest: No stridor. She has no wheezes. She has no rales. She exhibits no tenderness.  Abdominal: She exhibits no distension. There is tenderness. There is no rebound.  Minimal RLQ tenderness  Musculoskeletal: Normal range of motion. She exhibits no edema.  Lymphadenopathy:    She has no cervical adenopathy.  Neurological: She is oriented to person, place, and time. She exhibits normal muscle tone. Coordination normal.  Skin: No rash noted. No erythema.  Psychiatric: She has a normal mood and affect. Her behavior is normal.    ED Course  Procedures (including critical care time) DIAGNOSTIC STUDIES: Oxygen Saturation is 100% on RA, normal by my  interpretation.    COORDINATION OF CARE: 1:21 PM-Discussed treatment plan which includes UA with pt at bedside and pt agreed to plan.   Labs Review Labs Reviewed  URINALYSIS, ROUTINE W REFLEX MICROSCOPIC (NOT AT [facility])    MDM   Final diagnoses:  None     Patient urinalysis suggests UTI. Urine will be cultured patient will be put on Cipro  Bethann Berkshire, MD 01/17/16 1458  The chart was scribed for me under my direct supervision.  I personally performed the history, physical, and medical decision making and all procedures in the evaluation of this patient.Bethann Berkshire, MD 01/17/16 [phone]

## 2016-01-17 NOTE — ED Notes (Signed)
Pt states she has been having urinary burning, frequency, and urgency.  States this began Friday.

## 2016-01-17 NOTE — Telephone Encounter (Signed)
Left message I called to check to see if she got RX and was going oK

## 2016-01-18 NOTE — Telephone Encounter (Signed)
Had appt 7/3

## 2016-01-18 NOTE — Telephone Encounter (Signed)
Spoke with pt. Pt went to ER after not hearing from us. I advised pt I did call and leave her a message to call us back. Pt was given Cipro at ER. Pt has a follow up appt scheduled Monday with Dr. Emelda FearFerguson. Pt was advised to keep that appt. JSY

## 2016-01-18 NOTE — Telephone Encounter (Signed)
Done

## 2016-01-19 ENCOUNTER — Encounter: Payer: Self-pay | Admitting: Obstetrics and Gynecology

## 2016-01-19 ENCOUNTER — Ambulatory Visit (INDEPENDENT_AMBULATORY_CARE_PROVIDER_SITE_OTHER): Payer: Medicare Other | Admitting: Obstetrics and Gynecology

## 2016-01-19 VITALS — BP 120/74 | Ht 62.5 in | Wt 135.0 lb

## 2016-01-19 DIAGNOSIS — R110 Nausea: Secondary | ICD-10-CM

## 2016-01-19 DIAGNOSIS — R399 Unspecified symptoms and signs involving the genitourinary system: Secondary | ICD-10-CM

## 2016-01-19 DIAGNOSIS — N8110 Cystocele, unspecified: Secondary | ICD-10-CM

## 2016-01-19 DIAGNOSIS — N390 Urinary tract infection, site not specified: Secondary | ICD-10-CM | POA: Diagnosis not present

## 2016-01-19 NOTE — Progress Notes (Signed)
Patient ID: Misty [name], female   DOB: 11/22/1940, 75 y.o.   MRN: [id number]  Work in pt    [facility] Visit  @DATE @            Patient name: Misty [name] MRN [id number]  Date of birth: 04/08/1941  CC & HPI:  Misty [name] is a 75 y.o. female presenting today for f/u for UTI diagnosed in the ED 2 days ago. Pt was seen in the ED 2 days ago for evaluation of dysuria, frequency, urgency and low back pain, dx with UTI and started on Cipro. Urine culture was positive for infection. Pt states the Cipro is causing her  nausea. Pt was started on Macrodantin by this office for recurrent UTI on 01/12/16 and reports she was still taking this medication when she was seen in the ED.    ROS:  Review of Systems  Gastrointestinal: Positive for nausea.  Genitourinary: Positive for dysuria, urgency and frequency.  Musculoskeletal: Positive for back pain.  All other systems reviewed and are negative.   Pertinent History Reviewed:   Reviewed: Significant for recurrent UTI Medical         Past Medical History  Diagnosis Date  . Hypothyroidism   . GERD (gastroesophageal reflux disease)     refractory   . Arthritis   . Anxiety   . PONV (postoperative nausea and vomiting)   . Spinal headache     pt. states when she had rectocele repair she had a spinal & had a bad headache after  . Hypertension   . Chronic neck pain   . IBS (irritable bowel syndrome)   . Equivocal stress test 2009    NS STTW changes                              Surgical Hx:    Past Surgical History  Procedure Laterality Date  . Abdominal hysterectomy  1985  . Cholecystectomy  2009    Dr. Lovell SheehanJenkins  . Rectocele repair    . Tonsillectomy  youth  . Appendectomy  teenager  . Bunoinectomy      left foot  . Dilation and curettage of uterus      x 2  . Cystocele repair N/A 08/16/2012    Procedure: ANTERIOR REPAIR (CYSTOCELE);  Surgeon: Tilda BurrowJohn V Tigerlily Christine, MD;  Location: [facility];  Service: Gynecology;   Laterality: N/A;  . Colonoscopy  12/26/2003    ACZ:YSAYTKRMR:Normal rectum/Sessile, infiltrating, ulcerated process adjacent to the ileocecal valve suspicious for colonic neoplasm, biopsied multiple times/Shallow left-sided diverticula  . Colonoscopy  03/04/2004    ZSW:FUXNATRMR:Normal rectum/Shallow left-sided diverticula/Previously noted ulcerated area in the right colon was gone/Aspirin and nonsteroidal anti-inflammatory drugs are well-known to produce stricture, webs, and ulcerations throughout the upper gastrointestinal tract, small bowel, and even proximal colon  . Esophagogastroduodenoscopy  08/16/2007    FTD:DUKGURRMR:Normal esophagus, stomach, D1, D2.  . Esophagogastroduodenoscopy N/A 10/16/2013    KYH:CWCBJRMR:small HH/Abnormal gastric mucosa of doubtful clinical significance s/p bx   Medications: Reviewed & Updated - see associated section                       Current outpatient prescriptions:  .  ciprofloxacin (CIPRO) 500 MG tablet, Take 1 tablet (500 mg total) by mouth 2 (two) times daily. One po bid x 7 days, Disp: 14 tablet, Rfl: 0 .  fluticasone (FLONASE) 50 MCG/ACT nasal spray, Place  2 sprays into both nostrils daily as needed for allergies. , Disp: , Rfl:  .  ketotifen (ZADITOR) 0.025 % ophthalmic solution, Place 1 drop into both eyes 2 (two) times daily as needed (dry eyes)., Disp: , Rfl:  .  levothyroxine (SYNTHROID) 112 MCG tablet, Take 112 mcg by mouth daily. Patient has to have Brand Name Drug, Disp: , Rfl:  .  LORazepam (ATIVAN) 0.5 MG tablet, Take 0.25 mg by mouth at bedtime. , Disp: , Rfl:  .  nitrofurantoin (MACRODANTIN) 100 MG capsule, Take 1 capsule (100 mg total) by mouth at bedtime., Disp: 30 capsule, Rfl: 3 .  aspirin EC 81 MG tablet, Take 81 mg by mouth daily as needed for mild pain. Reported on 01/19/2016, Disp: , Rfl:  .  bismuth subsalicylate (PEPTO BISMOL) 262 MG/15ML suspension, Take 15 mLs by mouth every 6 (six) hours as needed for indigestion. Reported on 01/19/2016, Disp: , Rfl:    Social  History: Reviewed -  reports that she has never smoked. She has never used smokeless tobacco.  Objective Findings:  Vitals: Blood pressure 120/74, height 5' 2.5" (1.588 m), weight 135 lb (61.236 kg).  Physical Examination: Discussion only   Discussed with pt course of treatment. At end of discussion, pt had opportunity to ask questions and has no further questions at this time.   Greater than 50% was spent in counseling and coordination of care with the patient. Total time greater than: 15 minutes    Assessment & Plan:   A:  1. Urine culture on 01/17/16 from ED showed 50,000+ colonies gram negative colonies, indicative of UTI 2. Small cystocele   P:  1. Finish course of Cipro  2. F/u in 9 days for repeat urine culture  3. If urine culture is negative at that time, will place pt on 30 days of Macrodantin  4. Recheck in 6 weeks  5. If pt develops recurrent uti's may need to refer for cystoscopy.    By signing my name below, I, Doreatha Martin, attest that this documentation has been prepared under the direction and in the presence of Tilda Burrow, MD. Electronically Signed: Doreatha Martin, ED Scribe. 01/19/2016. 2:00 PM.  I personally performed the services described in this documentation, which was SCRIBED in my presence. The recorded information has been reviewed and considered accurate. It has been edited as necessary during review. Tilda Burrow, MD

## 2016-01-19 NOTE — Progress Notes (Signed)
Patient ID: Misty [name], female   DOB: 07/01/1941, 75 y.o.   MRN: [id number] Pt worked in today because symptoms are worse than last week. Pt states that she feels terrible. Pt was seen in the ER on Monday.

## 2016-01-20 ENCOUNTER — Other Ambulatory Visit (HOSPITAL_COMMUNITY): Payer: Self-pay | Admitting: Internal Medicine

## 2016-01-20 DIAGNOSIS — Z1231 Encounter for screening mammogram for malignant neoplasm of breast: Secondary | ICD-10-CM

## 2016-01-20 LAB — URINE CULTURE
Culture: 50000 — AB
Special Requests: NORMAL

## 2016-01-21 ENCOUNTER — Telehealth: Payer: Self-pay | Admitting: *Deleted

## 2016-01-21 NOTE — Telephone Encounter (Signed)
Post ED Visit - Positive Culture Follow-up  Culture report reviewed by antimicrobial stewardship pharmacist:  []  Enzo BiNathan Batchelder, Pharm.D. []  Celedonio MiyamotoJeremy Frens, Pharm.D., BCPS []  Garvin FilaMike Maccia, Pharm.D. [x]  Georgina PillionElizabeth Martin, [address] BCPS []  Horn HillMinh Pham, [address]Ph[address]D., BCPS, AAHIVP []  Estella HuskMichelle Turner, Pharm.D., BCPS, AAHIVP []  Tennis Mustassie Stewart, Pharm.D. []  Sherle Poeob Vincent, [address]  Positive urine culture Treated with Cephalexin, organism sensitive to the same and no further patient follow-up is required at this time.  Virl AxeRobertson, Genesis Novosad [facility] 01/21/2016, 11:31 AM

## 2016-01-22 ENCOUNTER — Observation Stay (HOSPITAL_COMMUNITY)
Admission: EM | Admit: 2016-01-22 | Discharge: 2016-01-24 | Disposition: A | Discharge status: Home / Self Care | Payer: Medicare Other | Attending: Internal Medicine | Admitting: Internal Medicine

## 2016-01-22 ENCOUNTER — Encounter (HOSPITAL_COMMUNITY): Payer: Self-pay | Admitting: *Deleted

## 2016-01-22 ENCOUNTER — Emergency Department (HOSPITAL_COMMUNITY): Payer: Medicare Other

## 2016-01-22 DIAGNOSIS — R509 Fever, unspecified: Secondary | ICD-10-CM | POA: Diagnosis not present

## 2016-01-22 DIAGNOSIS — I10 Essential (primary) hypertension: Secondary | ICD-10-CM | POA: Diagnosis not present

## 2016-01-22 DIAGNOSIS — R300 Dysuria: Secondary | ICD-10-CM | POA: Diagnosis present

## 2016-01-22 DIAGNOSIS — E039 Hypothyroidism, unspecified: Secondary | ICD-10-CM | POA: Insufficient documentation

## 2016-01-22 DIAGNOSIS — M1990 Unspecified osteoarthritis, unspecified site: Secondary | ICD-10-CM | POA: Diagnosis not present

## 2016-01-22 DIAGNOSIS — Z7982 Long term (current) use of aspirin: Secondary | ICD-10-CM | POA: Insufficient documentation

## 2016-01-22 DIAGNOSIS — R110 Nausea: Secondary | ICD-10-CM | POA: Diagnosis not present

## 2016-01-22 DIAGNOSIS — Z79899 Other long term (current) drug therapy: Secondary | ICD-10-CM | POA: Diagnosis not present

## 2016-01-22 DIAGNOSIS — N390 Urinary tract infection, site not specified: Principal | ICD-10-CM

## 2016-01-22 DIAGNOSIS — A419 Sepsis, unspecified organism: Secondary | ICD-10-CM | POA: Diagnosis not present

## 2016-01-22 DIAGNOSIS — N3000 Acute cystitis without hematuria: Secondary | ICD-10-CM

## 2016-01-22 LAB — URINALYSIS, ROUTINE W REFLEX MICROSCOPIC
BILIRUBIN URINE: NEGATIVE
GLUCOSE, UA: NEGATIVE mg/dL
HGB URINE DIPSTICK: NEGATIVE
KETONES UR: 15 mg/dL — AB
NITRITE: NEGATIVE
Protein, ur: 30 mg/dL — AB
Specific Gravity, Urine: 1.02 (ref 1.005–1.030)
pH: 6 (ref 5.0–8.0)

## 2016-01-22 LAB — COMPREHENSIVE METABOLIC PANEL
ALT: 17 U/L (ref 14–54)
AST: 27 U/L (ref 15–41)
Albumin: 4.2 g/dL (ref 3.5–5.0)
Alkaline Phosphatase: 61 U/L (ref 38–126)
Anion gap: 8 (ref 5–15)
BILIRUBIN TOTAL: 0.6 mg/dL (ref 0.3–1.2)
BUN: 17 mg/dL (ref 6–20)
CALCIUM: 8.8 mg/dL — AB (ref 8.9–10.3)
CO2: 23 mmol/L (ref 22–32)
CREATININE: 0.95 mg/dL (ref 0.44–1.00)
Chloride: 104 mmol/L (ref 101–111)
GFR, EST NON AFRICAN AMERICAN: 58 mL/min — AB (ref >60)
GLUCOSE: 155 mg/dL — AB (ref 65–99)
Potassium: 3.5 mmol/L (ref 3.5–5.1)
Sodium: 135 mmol/L (ref 135–145)
Total Protein: 7.3 g/dL (ref 6.5–8.1)

## 2016-01-22 LAB — CBC WITH DIFFERENTIAL/PLATELET
Basophils Absolute: 0 10*3/uL (ref 0.0–0.1)
Basophils Relative: 0 %
Eosinophils Absolute: 0.1 10*3/uL (ref 0.0–0.7)
Eosinophils Relative: 1 %
HEMATOCRIT: 40.3 % (ref 36.0–46.0)
Hemoglobin: 14.1 g/dL (ref 12.0–15.0)
LYMPHS ABS: 0.3 10*3/uL — AB (ref 0.7–4.0)
LYMPHS PCT: 3 %
MCH: 35 pg — AB (ref 26.0–34.0)
MCHC: 35 g/dL (ref 30.0–36.0)
MCV: 100 fL (ref 78.0–100.0)
MONO ABS: 0.8 10*3/uL (ref 0.1–1.0)
MONOS PCT: 6 %
NEUTROS ABS: 11.7 10*3/uL — AB (ref 1.7–7.7)
Neutrophils Relative %: 90 %
Platelets: 187 10*3/uL (ref 150–400)
RBC: 4.03 MIL/uL (ref 3.87–5.11)
RDW: 11.7 % (ref 11.5–15.5)
WBC: 12.9 10*3/uL — ABNORMAL HIGH (ref 4.0–10.5)

## 2016-01-22 LAB — URINE MICROSCOPIC-ADD ON

## 2016-01-22 LAB — LIPASE, BLOOD: Lipase: 15 U/L (ref 11–51)

## 2016-01-22 LAB — LACTIC ACID, PLASMA
Lactic Acid, Venous: 1.9 mmol/L (ref 0.5–1.9)
Lactic Acid, Venous: 2.7 mmol/L (ref 0.5–1.9)

## 2016-01-22 MED ORDER — ENOXAPARIN SODIUM 40 MG/0.4ML ~~LOC~~ SOLN
40.0000 mg | SUBCUTANEOUS | Status: DC
  Administered 2016-01-22 – 2016-01-23 (×2): 40 mg via SUBCUTANEOUS
  Filled 2016-01-22 (×2): qty 0.4

## 2016-01-22 MED ORDER — SODIUM CHLORIDE 0.9 % IV BOLUS (SEPSIS)
1000.0000 mL | Freq: Once | INTRAVENOUS | Status: AC
  Administered 2016-01-22: 1000 mL via INTRAVENOUS

## 2016-01-22 MED ORDER — LEVOTHYROXINE SODIUM 112 MCG PO TABS
112.0000 ug | ORAL_TABLET | Freq: Every day | ORAL | Status: DC
  Administered 2016-01-23 – 2016-01-24 (×2): 112 ug via ORAL
  Filled 2016-01-22 (×2): qty 1

## 2016-01-22 MED ORDER — CEFTRIAXONE SODIUM 1 G IJ SOLR
1.0000 g | INTRAMUSCULAR | Status: DC
  Administered 2016-01-23: 1 g via INTRAVENOUS
  Filled 2016-01-22 (×3): qty 10

## 2016-01-22 MED ORDER — ACETAMINOPHEN 650 MG RE SUPP: 650.0000 mg | Freq: Four times a day (QID) | RECTAL | Status: DC | PRN

## 2016-01-22 MED ORDER — ONDANSETRON HCL 4 MG/2ML IJ SOLN: 4.0000 mg | Freq: Four times a day (QID) | INTRAMUSCULAR | Status: DC | PRN

## 2016-01-22 MED ORDER — DIATRIZOATE MEGLUMINE & SODIUM 66-10 % PO SOLN
ORAL | Status: AC
  Administered 2016-01-22: 15 mL
  Filled 2016-01-22: qty 30

## 2016-01-22 MED ORDER — SODIUM CHLORIDE 0.9 % IV BOLUS (SEPSIS)
500.0000 mL | Freq: Once | INTRAVENOUS | Status: AC
  Administered 2016-01-22: 500 mL via INTRAVENOUS

## 2016-01-22 MED ORDER — SENNOSIDES-DOCUSATE SODIUM 8.6-50 MG PO TABS: 1.0000 | ORAL_TABLET | Freq: Every evening | ORAL | Status: DC | PRN

## 2016-01-22 MED ORDER — DEXTROSE 5 % IV SOLN
1.0000 g | Freq: Once | INTRAVENOUS | Status: AC
  Administered 2016-01-22: 1 g via INTRAVENOUS
  Filled 2016-01-22: qty 10

## 2016-01-22 MED ORDER — MECLIZINE HCL 12.5 MG PO TABS: 25.0000 mg | ORAL_TABLET | Freq: Three times a day (TID) | ORAL | Status: DC | PRN

## 2016-01-22 MED ORDER — ACETAMINOPHEN 325 MG PO TABS
650.0000 mg | ORAL_TABLET | Freq: Four times a day (QID) | ORAL | Status: DC | PRN
  Administered 2016-01-22 – 2016-01-23 (×3): 650 mg via ORAL
  Filled 2016-01-22 (×3): qty 2

## 2016-01-22 MED ORDER — MELOXICAM 15 MG PO TABS
7.5000 mg | ORAL_TABLET | Freq: Every day | ORAL | Status: DC | PRN
Start: 2016-01-22 — End: 2016-01-24
  Filled 2016-01-22: qty 1

## 2016-01-22 MED ORDER — ASPIRIN EC 81 MG PO TBEC: 81.0000 mg | DELAYED_RELEASE_TABLET | Freq: Every day | ORAL | Status: DC | PRN

## 2016-01-22 MED ORDER — LORAZEPAM 0.5 MG PO TABS
0.2500 mg | ORAL_TABLET | Freq: Every day | ORAL | Status: DC
  Administered 2016-01-22 – 2016-01-23 (×2): 0.25 mg via ORAL
  Filled 2016-01-22 (×2): qty 1

## 2016-01-22 MED ORDER — SODIUM CHLORIDE 0.9 % IV SOLN
INTRAVENOUS | Status: DC
  Administered 2016-01-22 – 2016-01-23 (×3): via INTRAVENOUS

## 2016-01-22 MED ORDER — SODIUM CHLORIDE 0.9 % IV SOLN: INTRAVENOUS | Status: AC

## 2016-01-22 MED ORDER — ACETAMINOPHEN 325 MG PO TABS
650.0000 mg | ORAL_TABLET | Freq: Once | ORAL | Status: AC
  Administered 2016-01-22: 650 mg via ORAL
  Filled 2016-01-22: qty 2

## 2016-01-22 MED ORDER — IOPAMIDOL (ISOVUE-300) INJECTION 61%
100.0000 mL | Freq: Once | INTRAVENOUS | Status: AC | PRN
  Administered 2016-01-22: 100 mL via INTRAVENOUS

## 2016-01-22 MED ORDER — IBUPROFEN 600 MG PO TABS: 600.0000 mg | ORAL_TABLET | Freq: Once | ORAL | Status: DC | PRN

## 2016-01-22 MED ORDER — DEXTROSE 5 % IV SOLN: 2.0000 g | Freq: Once | INTRAVENOUS | Status: DC

## 2016-01-22 MED ORDER — ONDANSETRON HCL 4 MG PO TABS: 4.0000 mg | ORAL_TABLET | Freq: Four times a day (QID) | ORAL | Status: DC | PRN

## 2016-01-22 MED ORDER — SODIUM CHLORIDE 0.9 % IV SOLN
INTRAVENOUS | Status: DC
  Administered 2016-01-22: 11:00:00 via INTRAVENOUS
  Administered 2016-01-22: 100 mL/h via INTRAVENOUS

## 2016-01-22 NOTE — ED Notes (Addendum)
Pt states she is being treated for a uti and was seen here on July 10th, 2017. Pt reports continued dysuria, urinary frequency and nausea. Pt states she is weak, unsteady on her feet, and thinks she is dehydrated.

## 2016-01-22 NOTE — ED Notes (Signed)
Lactic Acid 2.7. EDP notified.

## 2016-01-22 NOTE — Progress Notes (Signed)
Pharmacy Antibiotic Note  Misty [name] is a 75 y.o. female admitted on 01/22/2016 with UTI.  Pharmacy has been consulted for ROCEPHIN dosing.  Plan: Rocephin 1gm IV q24hrs Monitor labs, progress, c/s  Height:  (157.5 cm) Weight: 133 lb (60.328 kg) IBW/kg (Calculated) : 50.1  Temp (24hrs), Avg:99.5 F (37.5 C), Min:98 F (36.7 C), Max:102.1 F (38.9 C)   Recent Labs Lab 01/22/16 0735 01/22/16 1045  WBC 12.9*  --   CREATININE 0.95  --   LATICACIDVEN 1.9 2.7*    Estimated Creatinine Clearance: 44.5 mL/min (by C-G formula based on Cr of 0.95).    Allergies  Allergen Reactions  . Amoxicillin Nausea And Vomiting  . Asa [Aspirin] Swelling    Does take coated aspirin  . Demerol [Meperidine] Nausea And Vomiting  . Vicodin [Hydrocodone-Acetaminophen] Swelling    Swelling of face and turns skin orange  . Tramadol Nausea And Vomiting   Anti-infectives    Start     Dose/Rate Route Frequency Ordered Stop   01/23/16 1000  cefTRIAXone (ROCEPHIN) 1 g in dextrose 5 % 50 mL IVPB     1 g 100 mL/hr over 30 Minutes Intravenous Every 24 hours 01/22/16 1743     01/22/16 1730  cefTRIAXone (ROCEPHIN) 2 g in dextrose 5 % 50 mL IVPB  Status:  Discontinued     2 g 100 mL/hr over 30 Minutes Intravenous  Once 01/22/16 1720 01/22/16 1740   01/22/16 1030  cefTRIAXone (ROCEPHIN) 1 g in dextrose 5 % 50 mL IVPB     1 g 100 mL/hr over 30 Minutes Intravenous  Once 01/22/16 1024 01/22/16 1111     Recent Results (from the past 240 hour(s))  Urine culture     Status: Abnormal   Collection Time: 01/17/16  1:08 PM  Result Value Ref Range Status   Specimen Description URINE, CLEAN CATCH  Final   Special Requests Normal  Final   Culture 50,000 COLONIES/mL KLEBSIELLA PNEUMONIAE (A)  Final   Report Status 01/20/2016 FINAL  Final   Organism ID, Bacteria KLEBSIELLA PNEUMONIAE (A)  Final      Susceptibility   Klebsiella pneumoniae - MIC*    AMPICILLIN >=32 RESISTANT Resistant     CEFAZOLIN <=4  SENSITIVE Sensitive     CEFTRIAXONE <=1 SENSITIVE Sensitive     CIPROFLOXACIN <=0.25 SENSITIVE Sensitive     GENTAMICIN <=1 SENSITIVE Sensitive     IMIPENEM <=0.25 SENSITIVE Sensitive     NITROFURANTOIN 128 RESISTANT Resistant     TRIMETH/SULFA <=20 SENSITIVE Sensitive     AMPICILLIN/SULBACTAM 4 SENSITIVE Sensitive     PIP/TAZO <=4 SENSITIVE Sensitive     * 50,000 COLONIES/mL KLEBSIELLA PNEUMONIAE  Culture, blood (routine x 2)     Status: None (Preliminary result)   Collection Time: 01/22/16 11:34 AM  Result Value Ref Range Status   Specimen Description LEFT ANTECUBITAL  Final   Special Requests BOTTLES DRAWN AEROBIC AND ANAEROBIC [facility] EACH  Final   Culture PENDING  Incomplete   Report Status PENDING  Incomplete  Culture, blood (routine x 2)     Status: None (Preliminary result)   Collection Time: 01/22/16 11:39 AM  Result Value Ref Range Status   Specimen Description BLOOD RIGHT WRIST  Final   Special Requests BOTTLES DRAWN AEROBIC AND ANAEROBIC [facility] EACH  Final   Culture PENDING  Incomplete   Report Status PENDING  Incomplete   Thank you for allowing pharmacy to be a part of this patient's care.  Valrie HartHall, Elonda Giuliano A 01/22/2016 5:46 PM

## 2016-01-22 NOTE — ED Notes (Signed)
Pt had mult blankets. Explained to pt and family that wrapping up in blankets would increase her fever. Blankets removed. Plan of care discussed with pt and family.

## 2016-01-22 NOTE — ED Provider Notes (Signed)
CSN: [id number]     Arrival date & time 01/22/16  4782 History   First MD Initiated Contact with Patient 01/22/16 0710     Chief Complaint  Patient presents with  . Dysuria      HPI Pt was seen at 0710. Per pt, c/o gradual onset and persistence of constant dysuria, urinary frequency and urgency for the past 1+ month, worse over the past 1 week. Pt states she has been on 3 different courses of abx without improvement (Bactrim, macrobid, cipro). Pt has been evaluated by her OB/GYN Dr. Emelda Fear this past week, and was told "I need my bladder tacked up." Has been associated with nausea, right sided abd "pain," and generalized fatigue.  Denies flank pain, no fevers, no vomiting/diarrhea, no vaginal bleeding/discharge, no rash.    Past Medical History  Diagnosis Date  . Hypothyroidism   . GERD (gastroesophageal reflux disease)     refractory   . Arthritis   . Anxiety   . PONV (postoperative nausea and vomiting)   . Spinal headache     pt. states when she had rectocele repair she had a spinal & had a bad headache after  . Hypertension   . Chronic neck pain   . IBS (irritable bowel syndrome)   . Equivocal stress test 2009    NS STTW changes   Past Surgical History  Procedure Laterality Date  . Abdominal hysterectomy  1985  . Cholecystectomy  2009    Dr. Lovell Sheehan  . Rectocele repair    . Tonsillectomy  youth  . Appendectomy  teenager  . Bunoinectomy      left foot  . Dilation and curettage of uterus      x 2  . Cystocele repair N/A 08/16/2012    Procedure: ANTERIOR REPAIR (CYSTOCELE);  Surgeon: Tilda Burrow, MD;  Location: [facility];  Service: Gynecology;  Laterality: N/A;  . Colonoscopy  12/26/2003    NFA:OZHYQM rectum/Sessile, infiltrating, ulcerated process adjacent to the ileocecal valve suspicious for colonic neoplasm, biopsied multiple times/Shallow left-sided diverticula  . Colonoscopy  03/04/2004    VHQ:IONGEX rectum/Shallow left-sided diverticula/Previously noted ulcerated  area in the right colon was gone/Aspirin and nonsteroidal anti-inflammatory drugs are well-known to produce stricture, webs, and ulcerations throughout the upper gastrointestinal tract, small bowel, and even proximal colon  . Esophagogastroduodenoscopy  08/16/2007    BMW:UXLKGM esophagus, stomach, D1, D2.  . Esophagogastroduodenoscopy N/A 10/16/2013    WNU:UVOZD HH/Abnormal gastric mucosa of doubtful clinical significance s/p bx   Family History  Problem Relation Age of Onset  . Colon cancer Neg Hx   . Hypertension Mother   . Cancer Father     lung  . Hypertension Son    Social History  Substance Use Topics  . Smoking status: Never Smoker   . Smokeless tobacco: Never Used  . Alcohol Use: 0.6 oz/week    1 Glasses of wine per week     Comment: social, rare wine    Review of Systems ROS: Statement: All systems negative except as marked or noted in the HPI; Constitutional: Negative for fever and chills. +generalized weakness.; ; Eyes: Negative for eye pain, redness and discharge. ; ; ENMT: Negative for ear pain, hoarseness, nasal congestion, sinus pressure and sore throat. ; ; Cardiovascular: Negative for chest pain, palpitations, diaphoresis, dyspnea and peripheral edema. ; ; Respiratory: Negative for cough, wheezing and stridor. ; ; Gastrointestinal: +nausea, abd pain. Negative for vomiting, diarrhea, blood in stool, hematemesis, jaundice and rectal bleeding. . ; ;  Genitourinary: +dysuria. Negative for flank pain and hematuria. ; ; Musculoskeletal: Negative for back pain and neck pain. Negative for swelling and trauma.; ; Skin: Negative for pruritus, rash, abrasions, blisters, bruising and skin lesion.; ; Neuro: Negative for headache, lightheadedness and neck stiffness. Negative for altered level of consciousness, altered mental status, extremity weakness, paresthesias, involuntary movement, seizure and syncope.      Allergies  Amoxicillin; Asa; Demerol; Vicodin; and Tramadol  Home  Medications   Prior to Admission medications   Medication Sig Start Date End Date Taking? Authorizing Provider  aspirin EC 81 MG tablet Take 81 mg by mouth daily as needed for mild pain. Reported on 01/19/2016    Historical Provider, MD  bismuth subsalicylate (PEPTO BISMOL) 262 MG/15ML suspension Take 15 mLs by mouth every 6 (six) hours as needed for indigestion. Reported on 01/19/2016    Historical Provider, MD  ciprofloxacin (CIPRO) 500 MG tablet Take 1 tablet (500 mg total) by mouth 2 (two) times daily. One po bid x 7 days 01/17/16   Bethann Berkshire, MD  fluticasone [facility]) 50 MCG/ACT nasal spray Place 2 sprays into both nostrils daily as needed for allergies.  10/27/15   Historical Provider, MD  ketotifen (ZADITOR) 0.025 % ophthalmic solution Place 1 drop into both eyes 2 (two) times daily as needed (dry eyes).    Historical Provider, MD  levothyroxine (SYNTHROID) 112 MCG tablet Take 112 mcg by mouth daily. Patient has to have Brand Name Drug    Historical Provider, MD  LORazepam (ATIVAN) 0.5 MG tablet Take 0.25 mg by mouth at bedtime.     Historical Provider, MD  nitrofurantoin (MACRODANTIN) 100 MG capsule Take 1 capsule (100 mg total) by mouth at bedtime. 01/12/16   Tilda Burrow, MD   BP 117/61 mmHg  Pulse 110  Temp(Src) 98 F (36.7 C) (Oral)  Resp 16  Ht 5\' 2"  (1.575 m)  Wt 133 lb (60.328 kg)  BMI 24.32 kg/m2  SpO2 95%   BP 109/55 mmHg  Pulse 102  Temp(Src) 102.1 F (38.9 C) (Rectal)  Resp 16  Ht 5\' 2"  (1.575 m)  Wt 133 lb (60.328 kg)  BMI 24.32 kg/m2  SpO2 97%   07:38 Orthostatic Vital Signs KS  Orthostatic Lying  - BP- Lying: 134/64 mmHg ; Pulse- Lying: 101  Orthostatic Sitting - BP- Sitting: 116/82 mmHg ; Pulse- Sitting: 104  Orthostatic Standing at 0 minutes - BP- Standing at 0 minutes: 115/59 mmHg ; Pulse- Standing at 0 minutes: 128      10:21:12 Orthostatic Vital Signs AC  Orthostatic Lying  - BP- Lying: 109/55 mmHg ; Pulse- Lying: 104  Orthostatic Sitting - BP-  Sitting: 126/50 mmHg ; Pulse- Sitting: 110  Orthostatic Standing at 0 minutes - BP- Standing at 0 minutes: 118/78 mmHg ; Pulse- Standing at 0 minutes: 113       Physical Exam  0715: Physical examination:  Nursing notes reviewed; Vital signs and O2 SAT reviewed;  Constitutional: Well developed, Well nourished, Well hydrated, In no acute distress; Head:  Normocephalic, atraumatic; Eyes: EOMI, PERRL, No scleral icterus; ENMT: Mouth and pharynx normal, Mucous membranes moist; Neck: Supple, Full range of motion, No lymphadenopathy; Cardiovascular: Tachycardic rate and rhythm, No gallop; Respiratory: Breath sounds clear & equal bilaterally, No wheezes.  Speaking full sentences with ease, Normal respiratory effort/excursion; Chest: Nontender, Movement normal; Abdomen: Soft, +RLQ tenderness to palp. No rebound or guarding. Nondistended, Normal bowel sounds; Genitourinary: No CVA tenderness; Extremities: Pulses normal, No tenderness, No edema, No  calf edema or asymmetry.; Neuro: AA&Ox3, Major CN grossly intact.  Speech clear. No gross focal motor or sensory deficits in extremities.; Skin: Color normal, Warm, Dry.   ED Course  Procedures (including critical care time) Labs Review  Imaging Review  I have personally reviewed and evaluated these images and lab results as part of my medical decision-making.   EKG Interpretation None      MDM  MDM Reviewed: previous chart, nursing note and vitals Reviewed previous: labs Interpretation: labs and CT scan   Results for orders placed or performed during the hospital encounter of 01/22/16  Urinalysis, Routine w reflex microscopic (not at [facility])  Result Value Ref Range   Color, Urine YELLOW YELLOW   APPearance CLEAR CLEAR   Specific Gravity, Urine 1.020 1.005 - 1.030   pH 6.0 5.0 - 8.0   Glucose, UA NEGATIVE NEGATIVE mg/dL   Hgb urine dipstick NEGATIVE NEGATIVE   Bilirubin Urine NEGATIVE NEGATIVE   Ketones, ur 15 (A) NEGATIVE mg/dL   Protein, ur  30 (A) NEGATIVE mg/dL   Nitrite NEGATIVE NEGATIVE   Leukocytes, UA TRACE (A) NEGATIVE  Lactic acid, plasma  Result Value Ref Range   Lactic Acid, Venous 1.9 0.5 - 1.9 mmol/L  CBC with Differential  Result Value Ref Range   WBC 12.9 (H) 4.0 - 10.5 K/uL   RBC 4.03 3.87 - 5.11 MIL/uL   Hemoglobin 14.1 12.0 - 15.0 g/dL   HCT 16.140.3 09.636.0 - 04.546.0 %   MCV 100.0 78.0 - 100.0 fL   MCH 35.0 (H) 26.0 - 34.0 pg   MCHC 35.0 30.0 - 36.0 g/dL   RDW 40.911.7 81.111.5 - 91.415.5 %   Platelets 187 150 - 400 K/uL   Neutrophils Relative % 90 %   Neutro Abs 11.7 (H) 1.7 - 7.7 K/uL   Lymphocytes Relative 3 %   Lymphs Abs 0.3 (L) 0.7 - 4.0 K/uL   Monocytes Relative 6 %   Monocytes Absolute 0.8 0.1 - 1.0 K/uL   Eosinophils Relative 1 %   Eosinophils Absolute 0.1 0.0 - 0.7 K/uL   Basophils Relative 0 %   Basophils Absolute 0.0 0.0 - 0.1 K/uL  Comprehensive metabolic panel  Result Value Ref Range   Sodium 135 135 - 145 mmol/L   Potassium 3.5 3.5 - 5.1 mmol/L   Chloride 104 101 - 111 mmol/L   CO2 23 22 - 32 mmol/L   Glucose, Bld 155 (H) 65 - 99 mg/dL   BUN 17 6 - 20 mg/dL   Creatinine, Ser 7.820.95 0.44 - 1.00 mg/dL   Calcium 8.8 (L) 8.9 - 10.3 mg/dL   Total Protein 7.3 6.5 - 8.1 g/dL   Albumin 4.2 3.5 - 5.0 g/dL   AST 27 15 - 41 U/L   ALT 17 14 - 54 U/L   Alkaline Phosphatase 61 38 - 126 U/L   Total Bilirubin 0.6 0.3 - 1.2 mg/dL   GFR calc non Af Amer 58 (L) >60 mL/min   GFR calc Af Amer >60 >60 mL/min   Anion gap 8 5 - 15  Lipase, blood  Result Value Ref Range   Lipase 15 11 - 51 U/L  Urine microscopic-add on  Result Value Ref Range   Squamous Epithelial / LPF 0-5 (A) NONE SEEN   WBC, UA 0-5 0 - 5 WBC/hpf   RBC / HPF 0-5 0 - 5 RBC/hpf   Bacteria, UA MANY (A) NONE SEEN   Ct Abdomen Pelvis W Contrast 01/22/2016  CLINICAL  DATA:  Right-sided abdominal pain. EXAM: CT ABDOMEN AND PELVIS WITH CONTRAST TECHNIQUE: Multidetector CT imaging of the abdomen and pelvis was performed using the standard protocol  following bolus administration of intravenous contrast. CONTRAST:  ISOVUE-300 IOPAMIDOL (ISOVUE-300) INJECTION 61% COMPARISON:  CT scan of October 23, 2013. FINDINGS: Visualized lung bases are unremarkable. No significant osseous abnormality is noted. Status post cholecystectomy. Right hepatic cyst is noted. Otherwise, the liver, spleen and pancreas are unremarkable. Adrenal glands and kidneys are unremarkable. Bilateral parapelvic cysts are again noted. No hydronephrosis or renal obstruction is noted. There is no evidence of bowel obstruction. Atherosclerosis of abdominal aorta is noted without aneurysm formation. No abnormal fluid collection is noted. Urinary bladder appears normal. Status post hysterectomy. No significant adenopathy is noted. IMPRESSION: Aortic atherosclerosis. No acute abnormality seen in the abdomen or pelvis. Electronically Signed   By: Lupita Raider, M.D.   On: 01/22/2016 10:03    1100:  UC from 01/17/16 with +Klebsiella; will dose IV rocephin. IVF bolus given for tachycardic; but pt continues tachycardic on orthostatic VS. Rectal temp with +fever; APAP given. T/C to [facility] Dr. Ardyth Harps, case discussed, including:  HPI, pertinent PM/SHx, VS/PE, dx testing, ED course and treatment:  Agreeable to admit, requests to obtain [facility], write temporary orders, obtain observation medical bed to Dr. Alonza Smoker service     Samuel Jester, DO 01/23/16 0900

## 2016-01-22 NOTE — Progress Notes (Addendum)
Pt's temp spiked to 102.6.  Cools cloths applied to pressure areas, Tylenol given, and Dr notified.  Dr responded with order for cooling blanket if temp continued to increase.  Temp has now decreased to 98.5.Marland Kitchen. Dr also ordered Ibuprofen as needed

## 2016-01-22 NOTE — Progress Notes (Signed)
Dr. Ardyth HarpsHernandez paged and made aware of patients arrival to the unit.

## 2016-01-22 NOTE — H&P (Signed)
History and Physical    Misty [name]:[id number] DOB: 02-25-1941 DOA: 01/22/2016  Referring MD/NP/PA: Samuel Jester, EDP PCP: Carylon Perches, MD  Patient coming from: Home  Chief Complaint: "I have a UTI"  HPI: Misty [name] is a 75 y.o. female with history of hypothyroidism and recurrent UTI who has had 3 courses of antibiotics most recently Cipro presents to the hospital today complaining of increased dysuria. In the ED she was found to have a temp of 102.1, she was tachycardic and initially orthostatic, she was also found to have a lactic acid of 2.7. sepsis protocol has been initiated with IV fluids, has been started on Rocephin. Her most recent urine culture from July 15 shows Klebsiella that should have been sensitive to Cipro and is also sensitive to Rocephin. Admission has been requested for further evaluation and management.  Past Medical/Surgical History: Past Medical History  Diagnosis Date  . Hypothyroidism   . GERD (gastroesophageal reflux disease)     refractory   . Arthritis   . Anxiety   . PONV (postoperative nausea and vomiting)   . Spinal headache     pt. states when she had rectocele repair she had a spinal & had a bad headache after  . Hypertension   . Chronic neck pain   . IBS (irritable bowel syndrome)   . Equivocal stress test 2009    NS STTW changes    Past Surgical History  Procedure Laterality Date  . Abdominal hysterectomy  1985  . Cholecystectomy  2009    Dr. Lovell Sheehan  . Rectocele repair    . Tonsillectomy  youth  . Appendectomy  teenager  . Bunoinectomy      left foot  . Dilation and curettage of uterus      x 2  . Cystocele repair N/A 08/16/2012    Procedure: ANTERIOR REPAIR (CYSTOCELE);  Surgeon: Tilda Burrow, MD;  Location: [facility];  Service: Gynecology;  Laterality: N/A;  . Colonoscopy  12/26/2003    WJX:BJYNWG rectum/Sessile, infiltrating, ulcerated process adjacent to the ileocecal valve suspicious for colonic neoplasm,  biopsied multiple times/Shallow left-sided diverticula  . Colonoscopy  03/04/2004    NFA:OZHYQM rectum/Shallow left-sided diverticula/Previously noted ulcerated area in the right colon was gone/Aspirin and nonsteroidal anti-inflammatory drugs are well-known to produce stricture, webs, and ulcerations throughout the upper gastrointestinal tract, small bowel, and even proximal colon  . Esophagogastroduodenoscopy  08/16/2007    VHQ:IONGEX esophagus, stomach, D1, D2.  . Esophagogastroduodenoscopy N/A 10/16/2013    BMW:UXLKG HH/Abnormal gastric mucosa of doubtful clinical significance s/p bx    Social History:  reports that she has never smoked. She has never used smokeless tobacco. She reports that she drinks about 0.6 oz of alcohol per week. She reports that she does not use illicit drugs.  Allergies: Allergies  Allergen Reactions  . Amoxicillin Nausea And Vomiting  . Asa [Aspirin] Swelling    Does take coated aspirin  . Demerol [Meperidine] Nausea And Vomiting  . Vicodin [Hydrocodone-Acetaminophen] Swelling    Swelling of face and turns skin orange  . Tramadol Nausea And Vomiting    Family History:  Family History  Problem Relation Age of Onset  . Colon cancer Neg Hx   . Hypertension Mother   . Cancer Father     lung  . Hypertension Son     Prior to Admission medications   Medication Sig Start Date End Date Taking? Authorizing Provider  aspirin EC 81 MG tablet Take 81 mg by mouth daily  as needed for mild pain. Reported on 01/19/2016   Yes Historical Provider, MD  bismuth subsalicylate (PEPTO BISMOL) 262 MG/15ML suspension Take 15 mLs by mouth every 6 (six) hours as needed for indigestion. Reported on 01/19/2016   Yes Historical Provider, MD  ciprofloxacin (CIPRO) 500 MG tablet Take 1 tablet (500 mg total) by mouth 2 (two) times daily. One po bid x 7 days 01/17/16  Yes Bethann BerkshireJoseph Zammit, MD  fluticasone [facility](FLONASE) 50 MCG/ACT nasal spray Place 2 sprays into both nostrils daily as needed for  allergies.  10/27/15  Yes Historical Provider, MD  ketotifen (ZADITOR) 0.025 % ophthalmic solution Place 1 drop into both eyes 2 (two) times daily as needed (dry eyes).   Yes Historical Provider, MD  levothyroxine (SYNTHROID) 112 MCG tablet Take 112 mcg by mouth daily. Patient has to have Brand Name Drug   Yes Historical Provider, MD  LORazepam (ATIVAN) 0.5 MG tablet Take 0.25 mg by mouth at bedtime.    Yes Historical Provider, MD  meclizine (ANTIVERT) 25 MG tablet Take 25 mg by mouth 3 (three) times daily as needed for dizziness.   Yes Historical Provider, MD  meloxicam (MOBIC) 7.5 MG tablet Take 7.5 mg by mouth daily as needed for pain.   Yes Historical Provider, MD  Meth-Hyo-M Bl-Na Phos-Ph Sal (URIBEL PO) Take 1 tablet by mouth 2 (two) times daily.   Yes Historical Provider, MD  nitrofurantoin (MACRODANTIN) 100 MG capsule Take 1 capsule (100 mg total) by mouth at bedtime. 01/12/16  Yes Tilda BurrowJohn V Ferguson, MD    Review of Systems:  Constitutional: Denies fever, chills, diaphoresis, appetite change and fatigue.  HEENT: Denies photophobia, eye pain, redness, hearing loss, ear pain, congestion, sore throat, rhinorrhea, sneezing, mouth sores, trouble swallowing, neck pain, neck stiffness and tinnitus.   Respiratory: Denies SOB, DOE, cough, chest tightness,  and wheezing.   Cardiovascular: Denies chest pain, palpitations and leg swelling.  Gastrointestinal: Denies nausea, vomiting, abdominal pain, diarrhea, constipation, blood in stool and abdominal distention.  Genitourinary: Denies  hematuria, flank pain and difficulty urinating.  Endocrine: Denies: hot or cold intolerance, sweats, changes in hair or nails, polyuria, polydipsia. Musculoskeletal: Denies myalgias, back pain, joint swelling, arthralgias and gait problem.  Skin: Denies pallor, rash and wound.  Neurological: Denies dizziness, seizures, syncope, weakness, light-headedness, numbness and headaches.  Hematological: Denies adenopathy. Easy  bruising, personal or family bleeding history  Psychiatric/Behavioral: Denies suicidal ideation, mood changes, confusion, nervousness, sleep disturbance and agitation    Physical Exam: Filed Vitals:   01/22/16 1217 01/22/16 1230 01/22/16 1300 01/22/16 1320  BP: 99/48 104/51 98/49 101/39  Pulse: 96 96 91 91  Temp: 98.9 F (37.2 C)   99 F (37.2 C)  TempSrc: Oral   Oral  Resp: 16   18  Height:    5\' 2"  (1.575 m)  Weight:    60.328 kg (133 lb)  SpO2: 96% 92% 93% 96%     Constitutional: NAD, calm, comfortable Eyes: PERRL, lids and conjunctivae normal ENMT: Mucous membranes are moist. Posterior pharynx clear of any exudate or lesions.Normal dentition.  Neck: normal, supple, no masses, no thyromegaly Respiratory: clear to auscultation bilaterally, no wheezing, no crackles. Normal respiratory effort. No accessory muscle use.  Cardiovascular: Regular rate and rhythm, no murmurs / rubs / gallops. No extremity edema. 2+ pedal pulses. No carotid bruits.  Abdomen: no tenderness, no masses palpated. No hepatosplenomegaly. Bowel sounds positive.  Musculoskeletal: no clubbing / cyanosis. No joint deformity upper and lower extremities. Good ROM, no  contractures. Normal muscle tone.  Skin: no rashes, lesions, ulcers. No induration Neurologic: CN 2-12 grossly intact. Sensation intact, DTR normal. Strength 5/5 in all 4.  Psychiatric: Normal judgment and insight. Alert and oriented x 3. Normal mood.    Labs on Admission: I have personally reviewed the following labs and imaging studies  CBC:  Recent Labs Lab 01/22/16 0735  WBC 12.9*  NEUTROABS 11.7*  HGB 14.1  HCT 40.3  MCV 100.0  PLT 187   Basic Metabolic Panel:  Recent Labs Lab 01/22/16 0735  NA 135  K 3.5  CL 104  CO2 23  GLUCOSE 155*  BUN 17  CREATININE 0.95  CALCIUM 8.8*   GFR: Estimated Creatinine Clearance: 44.5 mL/min (by C-G formula based on Cr of 0.95). Liver Function Tests:  Recent Labs Lab 01/22/16 0735    AST 27  ALT 17  ALKPHOS 61  BILITOT 0.6  PROT 7.3  ALBUMIN 4.2    Recent Labs Lab 01/22/16 0735  LIPASE 15   No results for input(s): AMMONIA in the last 168 hours. Coagulation Profile: No results for input(s): INR, PROTIME in the last 168 hours. Cardiac Enzymes: No results for input(s): CKTOTAL, CKMB, CKMBINDEX, TROPONINI in the last 168 hours. BNP (last 3 results) No results for input(s): PROBNP in the last 8760 hours. HbA1C: No results for input(s): HGBA1C in the last 72 hours. CBG: No results for input(s): GLUCAP in the last 168 hours. Lipid Profile: No results for input(s): CHOL, HDL, LDLCALC, TRIG, CHOLHDL, LDLDIRECT in the last 72 hours. Thyroid Function Tests: No results for input(s): TSH, T4TOTAL, FREET4, T3FREE, THYROIDAB in the last 72 hours. Anemia Panel: No results for input(s): VITAMINB12, FOLATE, FERRITIN, TIBC, IRON, RETICCTPCT in the last 72 hours. Urine analysis:    Component Value Date/Time   COLORURINE YELLOW 01/22/2016 0700   APPEARANCEUR CLEAR 01/22/2016 0700   LABSPEC 1.020 01/22/2016 0700   PHURINE 6.0 01/22/2016 0700   GLUCOSEU NEGATIVE 01/22/2016 0700   HGBUR NEGATIVE 01/22/2016 0700   BILIRUBINUR NEGATIVE 01/22/2016 0700   KETONESUR 15* 01/22/2016 0700   PROTEINUR 30* 01/22/2016 0700   UROBILINOGEN 0.2 08/09/2012 1406   NITRITE NEGATIVE 01/22/2016 0700   LEUKOCYTESUR TRACE* 01/22/2016 0700   Sepsis Labs: @LABRCNTIP (procalcitonin:4,lacticidven:4) ) Recent Results (from the past 240 hour(s))  Urine culture     Status: Abnormal   Collection Time: 01/17/16  1:08 PM  Result Value Ref Range Status   Specimen Description URINE, CLEAN CATCH  Final   Special Requests Normal  Final   Culture 50,000 COLONIES/mL KLEBSIELLA PNEUMONIAE (A)  Final   Report Status 01/20/2016 FINAL  Final   Organism ID, Bacteria KLEBSIELLA PNEUMONIAE (A)  Final      Susceptibility   Klebsiella pneumoniae - MIC*    AMPICILLIN >=32 RESISTANT Resistant      CEFAZOLIN <=4 SENSITIVE Sensitive     CEFTRIAXONE <=1 SENSITIVE Sensitive     CIPROFLOXACIN <=0.25 SENSITIVE Sensitive     GENTAMICIN <=1 SENSITIVE Sensitive     IMIPENEM <=0.25 SENSITIVE Sensitive     NITROFURANTOIN 128 RESISTANT Resistant     TRIMETH/SULFA <=20 SENSITIVE Sensitive     AMPICILLIN/SULBACTAM 4 SENSITIVE Sensitive     PIP/TAZO <=4 SENSITIVE Sensitive     * 50,000 COLONIES/mL KLEBSIELLA PNEUMONIAE  Culture, blood (routine x 2)     Status: None (Preliminary result)   Collection Time: 01/22/16 11:34 AM  Result Value Ref Range Status   Specimen Description LEFT ANTECUBITAL  Final   Special Requests BOTTLES  DRAWN AEROBIC AND ANAEROBIC [facility] EACH  Final   Culture PENDING  Incomplete   Report Status PENDING  Incomplete  Culture, blood (routine x 2)     Status: None (Preliminary result)   Collection Time: 01/22/16 11:39 AM  Result Value Ref Range Status   Specimen Description BLOOD RIGHT WRIST  Final   Special Requests BOTTLES DRAWN AEROBIC AND ANAEROBIC [facility] EACH  Final   Culture PENDING  Incomplete   Report Status PENDING  Incomplete     Radiological Exams on Admission: Ct Abdomen Pelvis W Contrast  01/22/2016  CLINICAL DATA:  Right-sided abdominal pain. EXAM: CT ABDOMEN AND PELVIS WITH CONTRAST TECHNIQUE: Multidetector CT imaging of the abdomen and pelvis was performed using the standard protocol following bolus administration of intravenous contrast. CONTRAST:  ISOVUE-300 IOPAMIDOL (ISOVUE-300) INJECTION 61% COMPARISON:  CT scan of October 23, 2013. FINDINGS: Visualized lung bases are unremarkable. No significant osseous abnormality is noted. Status post cholecystectomy. Right hepatic cyst is noted. Otherwise, the liver, spleen and pancreas are unremarkable. Adrenal glands and kidneys are unremarkable. Bilateral parapelvic cysts are again noted. No hydronephrosis or renal obstruction is noted. There is no evidence of bowel obstruction. Atherosclerosis of abdominal aorta is  noted without aneurysm formation. No abnormal fluid collection is noted. Urinary bladder appears normal. Status post hysterectomy. No significant adenopathy is noted. IMPRESSION: Aortic atherosclerosis. No acute abnormality seen in the abdomen or pelvis. Electronically Signed   By: Lupita Raider, M.D.   On: 01/22/2016 10:03    EKG: Independently reviewed. None obtained in the ED  Assessment/Plan Principal Problem:   Sepsis (HCC) Active Problems:   UTI (urinary tract infection)    Sepsis -on account of urinary tract infection. -Plan to admit for IV fluids, Rocephin, repeat urine culture, IV fluid administration given orthostasis.  Hypothyroidism -Continue Synthroid   DVT prophylaxis: Lovenox  Code Status: Full code  Family Communication: Patient only  Disposition Plan: [facility] for discharge home in 24-48 hours  Consults called: None  Admission status: Observation    Time Spent: 65 minutes  Chaya Jan MD [facility] Pager [phone]  If 7PM-7AM, please contact night-coverage [email or web address] Password [email or web address]  01/22/2016, 5:20 PM

## 2016-01-23 LAB — CBC WITH DIFFERENTIAL/PLATELET
BASOS ABS: 0 10*3/uL (ref 0.0–0.1)
BASOS PCT: 0 %
Eosinophils Absolute: 0.3 10*3/uL (ref 0.0–0.7)
Eosinophils Relative: 4 %
HEMATOCRIT: 33.8 % — AB (ref 36.0–46.0)
Hemoglobin: 11.7 g/dL — ABNORMAL LOW (ref 12.0–15.0)
LYMPHS PCT: 13 %
Lymphs Abs: 0.9 10*3/uL (ref 0.7–4.0)
MCH: 34.8 pg — ABNORMAL HIGH (ref 26.0–34.0)
MCHC: 34.6 g/dL (ref 30.0–36.0)
MCV: 100.6 fL — AB (ref 78.0–100.0)
Monocytes Absolute: 0.5 10*3/uL (ref 0.1–1.0)
Monocytes Relative: 8 %
NEUTROS ABS: 5.4 10*3/uL (ref 1.7–7.7)
NEUTROS PCT: 75 %
PLATELETS: 138 10*3/uL — AB (ref 150–400)
RBC: 3.36 MIL/uL — AB (ref 3.87–5.11)
RDW: 12.2 % (ref 11.5–15.5)
WBC: 7.1 10*3/uL (ref 4.0–10.5)

## 2016-01-23 LAB — BASIC METABOLIC PANEL
Anion gap: 5 (ref 5–15)
BUN: 11 mg/dL (ref 6–20)
CALCIUM: 7.7 mg/dL — AB (ref 8.9–10.3)
CO2: 21 mmol/L — AB (ref 22–32)
CREATININE: 0.62 mg/dL (ref 0.44–1.00)
Chloride: 113 mmol/L — ABNORMAL HIGH (ref 101–111)
GFR calc Af Amer: >60 mL/min (ref >60)
GFR calc non Af Amer: >60 mL/min (ref >60)
GLUCOSE: 89 mg/dL (ref 65–99)
Potassium: 2.8 mmol/L — ABNORMAL LOW (ref 3.5–5.1)
Sodium: 139 mmol/L (ref 135–145)

## 2016-01-23 LAB — COMPREHENSIVE METABOLIC PANEL
ALBUMIN: 2.9 g/dL — AB (ref 3.5–5.0)
ALT: 18 U/L (ref 14–54)
ANION GAP: 4 — AB (ref 5–15)
AST: 28 U/L (ref 15–41)
Alkaline Phosphatase: 43 U/L (ref 38–126)
BILIRUBIN TOTAL: 0.6 mg/dL (ref 0.3–1.2)
BUN: 12 mg/dL (ref 6–20)
CHLORIDE: 114 mmol/L — AB (ref 101–111)
CO2: 20 mmol/L — ABNORMAL LOW (ref 22–32)
Calcium: 7.5 mg/dL — ABNORMAL LOW (ref 8.9–10.3)
Creatinine, Ser: 0.64 mg/dL (ref 0.44–1.00)
GFR calc Af Amer: >60 mL/min (ref >60)
Glucose, Bld: 91 mg/dL (ref 65–99)
POTASSIUM: 2.9 mmol/L — AB (ref 3.5–5.1)
Sodium: 138 mmol/L (ref 135–145)
TOTAL PROTEIN: 5.5 g/dL — AB (ref 6.5–8.1)

## 2016-01-23 LAB — CBC
HCT: 32.9 % — ABNORMAL LOW (ref 36.0–46.0)
Hemoglobin: 11.4 g/dL — ABNORMAL LOW (ref 12.0–15.0)
MCH: 34.5 pg — AB (ref 26.0–34.0)
MCHC: 34.7 g/dL (ref 30.0–36.0)
MCV: 99.7 fL (ref 78.0–100.0)
PLATELETS: 151 10*3/uL (ref 150–400)
RBC: 3.3 MIL/uL — ABNORMAL LOW (ref 3.87–5.11)
RDW: 12 % (ref 11.5–15.5)
WBC: 6.4 10*3/uL (ref 4.0–10.5)

## 2016-01-23 LAB — LACTIC ACID, PLASMA
LACTIC ACID, VENOUS: 0.8 mmol/L (ref 0.5–1.9)
LACTIC ACID, VENOUS: 0.8 mmol/L (ref 0.5–1.9)

## 2016-01-23 LAB — PROTIME-INR
INR: 1.32 (ref 0.00–1.49)
PROTHROMBIN TIME: 16.5 s — AB (ref 11.6–15.2)

## 2016-01-23 LAB — APTT: APTT: 46 s — AB (ref 24–37)

## 2016-01-23 LAB — PROCALCITONIN: Procalcitonin: 0.48 ng/mL

## 2016-01-23 MED ORDER — POTASSIUM CHLORIDE CRYS ER 20 MEQ PO TBCR
20.0000 meq | EXTENDED_RELEASE_TABLET | Freq: Four times a day (QID) | ORAL | Status: DC
  Administered 2016-01-23 (×4): 20 meq via ORAL
  Filled 2016-01-23 (×4): qty 1

## 2016-01-23 NOTE — Care Management Obs Status (Signed)
MEDICARE OBSERVATION STATUS NOTIFICATION   Patient Details  Name: Misty [name] MRN: [id number] Date of Birth: 07/19/1940   Medicare Observation Status Notification Given:  Yes    Fuller PlanWelborn, Wofford Stratton M, RN 01/23/2016, 12:14 PM

## 2016-01-23 NOTE — Progress Notes (Signed)
Subjective: She feels stronger today. She had a fever up to 102.6 yesterday. Afebrile now. No vomiting.  Objective: Vital signs in last 24 hours: Filed Vitals:   01/22/16 2335 01/23/16 0102 01/23/16 0404 01/23/16 0614  BP:    97/58  Pulse:    100  Temp: 98.3 F (36.8 C) 98.9 F (37.2 C) 99.6 F (37.6 C) 98.5 F (36.9 C)  TempSrc: Oral Oral Oral Oral  Resp:    20  Height:      Weight:      SpO2:    94%   Weight change: -0 oz (-0 kg)  Intake/Output Summary (Last 24 hours) at 01/23/16 0903 Last data filed at 01/23/16 0849  Gross per 24 hour  Intake    360 ml  Output     50 ml  Net    310 ml    Physical Exam: Alert. No distress. Appears weaker than usual. Lungs clear. Heart regular with no murmurs. Abdomen nontender with no hepatosplenomegaly. No CVA tenderness. Extremities reveal no edema.  Lab Results:    Results for orders placed or performed during the hospital encounter of 01/22/16 (from the past 24 hour(s))  Lactic acid, plasma     Status: Abnormal   Collection Time: 01/22/16 10:45 AM  Result Value Ref Range   Lactic Acid, Venous 2.7 (HH) 0.5 - 1.9 mmol/L  Culture, blood (routine x 2)     Status: None (Preliminary result)   Collection Time: 01/22/16 11:34 AM  Result Value Ref Range   Specimen Description LEFT ANTECUBITAL    Special Requests BOTTLES DRAWN AEROBIC AND ANAEROBIC 6CC EACH    Culture NO GROWTH < 24 HOURS    Report Status PENDING   Culture, blood (routine x 2)     Status: None (Preliminary result)   Collection Time: 01/22/16 11:39 AM  Result Value Ref Range   Specimen Description BLOOD RIGHT WRIST    Special Requests BOTTLES DRAWN AEROBIC AND ANAEROBIC 6CC EACH    Culture NO GROWTH < 24 HOURS    Report Status PENDING   CBC with Differential     Status: Abnormal   Collection Time: 01/23/16  1:37 AM  Result Value Ref Range   WBC 7.1 4.0 - 10.5 K/uL   RBC 3.36 (L) 3.87 - 5.11 MIL/uL   Hemoglobin 11.7 (L) 12.0 - 15.0 g/dL   HCT 16.133.8 (L) 09.636.0 -  46.0 %   MCV 100.6 (H) 78.0 - 100.0 fL   MCH 34.8 (H) 26.0 - 34.0 pg   MCHC 34.6 30.0 - 36.0 g/dL   RDW 04.512.2 40.911.5 - 81.115.5 %   Platelets 138 (L) 150 - 400 K/uL   Neutrophils Relative % 75 %   Neutro Abs 5.4 1.7 - 7.7 K/uL   Lymphocytes Relative 13 %   Lymphs Abs 0.9 0.7 - 4.0 K/uL   Monocytes Relative 8 %   Monocytes Absolute 0.5 0.1 - 1.0 K/uL   Eosinophils Relative 4 %   Eosinophils Absolute 0.3 0.0 - 0.7 K/uL   Basophils Relative 0 %   Basophils Absolute 0.0 0.0 - 0.1 K/uL  Comprehensive metabolic panel     Status: Abnormal   Collection Time: 01/23/16  1:37 AM  Result Value Ref Range   Sodium 138 135 - 145 mmol/L   Potassium 2.9 (L) 3.5 - 5.1 mmol/L   Chloride 114 (H) 101 - 111 mmol/L   CO2 20 (L) 22 - 32 mmol/L   Glucose, Bld 91 65 - 99 mg/dL  BUN 12 6 - 20 mg/dL   Creatinine, Ser 5.62 0.44 - 1.00 mg/dL   Calcium 7.5 (L) 8.9 - 10.3 mg/dL   Total Protein 5.5 (L) 6.5 - 8.1 g/dL   Albumin 2.9 (L) 3.5 - 5.0 g/dL   AST 28 15 - 41 U/L   ALT 18 14 - 54 U/L   Alkaline Phosphatase 43 38 - 126 U/L   Total Bilirubin 0.6 0.3 - 1.2 mg/dL   GFR calc non Af Amer >60 >60 mL/min   GFR calc Af Amer >60 >60 mL/min   Anion gap 4 (L) 5 - 15  Lactic acid, plasma     Status: None   Collection Time: 01/23/16  1:37 AM  Result Value Ref Range   Lactic Acid, Venous 0.8 0.5 - 1.9 mmol/L  Procalcitonin     Status: None   Collection Time: 01/23/16  1:37 AM  Result Value Ref Range   Procalcitonin 0.48 ng/mL  Protime-INR     Status: Abnormal   Collection Time: 01/23/16  1:37 AM  Result Value Ref Range   Prothrombin Time 16.5 (H) 11.6 - 15.2 seconds   INR 1.32 0.00 - 1.49  APTT     Status: Abnormal   Collection Time: 01/23/16  1:37 AM  Result Value Ref Range   aPTT 46 (H) 24 - 37 seconds  Basic metabolic panel     Status: Abnormal   Collection Time: 01/23/16  5:30 AM  Result Value Ref Range   Sodium 139 135 - 145 mmol/L   Potassium 2.8 (L) 3.5 - 5.1 mmol/L   Chloride 113 (H) 101 - 111  mmol/L   CO2 21 (L) 22 - 32 mmol/L   Glucose, Bld 89 65 - 99 mg/dL   BUN 11 6 - 20 mg/dL   Creatinine, Ser 1.30 0.44 - 1.00 mg/dL   Calcium 7.7 (L) 8.9 - 10.3 mg/dL   GFR calc non Af Amer >60 >60 mL/min   GFR calc Af Amer >60 >60 mL/min   Anion gap 5 5 - 15  CBC     Status: Abnormal   Collection Time: 01/23/16  5:30 AM  Result Value Ref Range   WBC 6.4 4.0 - 10.5 K/uL   RBC 3.30 (L) 3.87 - 5.11 MIL/uL   Hemoglobin 11.4 (L) 12.0 - 15.0 g/dL   HCT 86.5 (L) 78.4 - 69.6 %   MCV 99.7 78.0 - 100.0 fL   MCH 34.5 (H) 26.0 - 34.0 pg   MCHC 34.7 30.0 - 36.0 g/dL   RDW 29.5 28.4 - 13.2 %   Platelets 151 150 - 400 K/uL  Lactic acid, plasma     Status: None   Collection Time: 01/23/16  5:30 AM  Result Value Ref Range   Lactic Acid, Venous 0.8 0.5 - 1.9 mmol/L     ABGS No results for input(s): PHART, PO2ART, TCO2, HCO3 in the last 72 hours.  Invalid input(s): PCO2 CULTURES Recent Results (from the past 240 hour(s))  Urine culture     Status: Abnormal   Collection Time: 01/17/16  1:08 PM  Result Value Ref Range Status   Specimen Description URINE, CLEAN CATCH  Final   Special Requests Normal  Final   Culture 50,000 COLONIES/mL KLEBSIELLA PNEUMONIAE (A)  Final   Report Status 01/20/2016 FINAL  Final   Organism ID, Bacteria KLEBSIELLA PNEUMONIAE (A)  Final      Susceptibility   Klebsiella pneumoniae - MIC*    AMPICILLIN >=32 RESISTANT Resistant  CEFAZOLIN <=4 SENSITIVE Sensitive     CEFTRIAXONE <=1 SENSITIVE Sensitive     CIPROFLOXACIN <=0.25 SENSITIVE Sensitive     GENTAMICIN <=1 SENSITIVE Sensitive     IMIPENEM <=0.25 SENSITIVE Sensitive     NITROFURANTOIN 128 RESISTANT Resistant     TRIMETH/SULFA <=20 SENSITIVE Sensitive     AMPICILLIN/SULBACTAM 4 SENSITIVE Sensitive     PIP/TAZO <=4 SENSITIVE Sensitive     * 50,000 COLONIES/mL KLEBSIELLA PNEUMONIAE  Culture, blood (routine x 2)     Status: None (Preliminary result)   Collection Time: 01/22/16 11:34 AM  Result Value Ref  Range Status   Specimen Description LEFT ANTECUBITAL  Final   Special Requests BOTTLES DRAWN AEROBIC AND ANAEROBIC 6CC EACH  Final   Culture NO GROWTH < 24 HOURS  Final   Report Status PENDING  Incomplete  Culture, blood (routine x 2)     Status: None (Preliminary result)   Collection Time: 01/22/16 11:39 AM  Result Value Ref Range Status   Specimen Description BLOOD RIGHT WRIST  Final   Special Requests BOTTLES DRAWN AEROBIC AND ANAEROBIC 6CC EACH  Final   Culture NO GROWTH < 24 HOURS  Final   Report Status PENDING  Incomplete   Studies/Results: Ct Abdomen Pelvis W Contrast  01/22/2016  CLINICAL DATA:  Right-sided abdominal pain. EXAM: CT ABDOMEN AND PELVIS WITH CONTRAST TECHNIQUE: Multidetector CT imaging of the abdomen and pelvis was performed using the standard protocol following bolus administration of intravenous contrast. CONTRAST:  ISOVUE-300 IOPAMIDOL (ISOVUE-300) INJECTION 61% COMPARISON:  CT scan of October 23, 2013. FINDINGS: Visualized lung bases are unremarkable. No significant osseous abnormality is noted. Status post cholecystectomy. Right hepatic cyst is noted. Otherwise, the liver, spleen and pancreas are unremarkable. Adrenal glands and kidneys are unremarkable. Bilateral parapelvic cysts are again noted. No hydronephrosis or renal obstruction is noted. There is no evidence of bowel obstruction. Atherosclerosis of abdominal aorta is noted without aneurysm formation. No abnormal fluid collection is noted. Urinary bladder appears normal. Status post hysterectomy. No significant adenopathy is noted. IMPRESSION: Aortic atherosclerosis. No acute abnormality seen in the abdomen or pelvis. Electronically Signed   By: Lupita Raider, M.D.   On: 01/22/2016 10:03   Micro Results: Recent Results (from the past 240 hour(s))  Urine culture     Status: Abnormal   Collection Time: 01/17/16  1:08 PM  Result Value Ref Range Status   Specimen Description URINE, CLEAN CATCH  Final    Special Requests Normal  Final   Culture 50,000 COLONIES/mL KLEBSIELLA PNEUMONIAE (A)  Final   Report Status 01/20/2016 FINAL  Final   Organism ID, Bacteria KLEBSIELLA PNEUMONIAE (A)  Final      Susceptibility   Klebsiella pneumoniae - MIC*    AMPICILLIN >=32 RESISTANT Resistant     CEFAZOLIN <=4 SENSITIVE Sensitive     CEFTRIAXONE <=1 SENSITIVE Sensitive     CIPROFLOXACIN <=0.25 SENSITIVE Sensitive     GENTAMICIN <=1 SENSITIVE Sensitive     IMIPENEM <=0.25 SENSITIVE Sensitive     NITROFURANTOIN 128 RESISTANT Resistant     TRIMETH/SULFA <=20 SENSITIVE Sensitive     AMPICILLIN/SULBACTAM 4 SENSITIVE Sensitive     PIP/TAZO <=4 SENSITIVE Sensitive     * 50,000 COLONIES/mL KLEBSIELLA PNEUMONIAE  Culture, blood (routine x 2)     Status: None (Preliminary result)   Collection Time: 01/22/16 11:34 AM  Result Value Ref Range Status   Specimen Description LEFT ANTECUBITAL  Final   Special Requests BOTTLES DRAWN AEROBIC AND  ANAEROBIC 6CC EACH  Final   Culture NO GROWTH < 24 HOURS  Final   Report Status PENDING  Incomplete  Culture, blood (routine x 2)     Status: None (Preliminary result)   Collection Time: 01/22/16 11:39 AM  Result Value Ref Range Status   Specimen Description BLOOD RIGHT WRIST  Final   Special Requests BOTTLES DRAWN AEROBIC AND ANAEROBIC 6CC EACH  Final   Culture NO GROWTH < 24 HOURS  Final   Report Status PENDING  Incomplete   Studies/Results: Ct Abdomen Pelvis W Contrast  01/22/2016  CLINICAL DATA:  Right-sided abdominal pain. EXAM: CT ABDOMEN AND PELVIS WITH CONTRAST TECHNIQUE: Multidetector CT imaging of the abdomen and pelvis was performed using the standard protocol following bolus administration of intravenous contrast. CONTRAST:  ISOVUE-300 IOPAMIDOL (ISOVUE-300) INJECTION 61% COMPARISON:  CT scan of October 23, 2013. FINDINGS: Visualized lung bases are unremarkable. No significant osseous abnormality is noted. Status post cholecystectomy. Right hepatic cyst is  noted. Otherwise, the liver, spleen and pancreas are unremarkable. Adrenal glands and kidneys are unremarkable. Bilateral parapelvic cysts are again noted. No hydronephrosis or renal obstruction is noted. There is no evidence of bowel obstruction. Atherosclerosis of abdominal aorta is noted without aneurysm formation. No abnormal fluid collection is noted. Urinary bladder appears normal. Status post hysterectomy. No significant adenopathy is noted. IMPRESSION: Aortic atherosclerosis. No acute abnormality seen in the abdomen or pelvis. Electronically Signed   By: Lupita Raider, M.D.   On: 01/22/2016 10:03   Medications:  I have reviewed the patient's current medications Scheduled Meds: . cefTRIAXone (ROCEPHIN)  IV  1 g Intravenous Q24H  . enoxaparin (LOVENOX) injection  40 mg Subcutaneous Q24H  . levothyroxine  112 mcg Oral QAC breakfast  . LORazepam  0.25 mg Oral QHS  . potassium chloride  20 mEq Oral QID   Continuous Infusions: . sodium chloride 75 mL/hr at 01/23/16 0351   PRN Meds:.acetaminophen **OR** acetaminophen, aspirin EC, ibuprofen, meclizine, meloxicam, ondansetron **OR** ondansetron (ZOFRAN) IV, senna-docusate   Assessment/Plan: #1. Klebsiella UTI with sepsis. Continue Rocephin. Blood cultures negative so far. White count normal. Elevated lactic acid level has normalized. Pro-calcitonin is normal. Continue IV fluids. Abdominal CT reveals normal-appearing kidneys. No sign of abscess is noted. Blood pressure is low normal. #2. Hypokalemia. Serum potassium has dropped to 2.8. Supplement orally. Principal Problem:   Sepsis (HCC) Active Problems:   UTI (urinary tract infection)       Marsha Gundlach 01/23/2016, 9:03 AM

## 2016-01-24 ENCOUNTER — Ambulatory Visit: Payer: Medicare Other | Admitting: Obstetrics and Gynecology

## 2016-01-24 LAB — BASIC METABOLIC PANEL
Anion gap: 6 (ref 5–15)
BUN: 7 mg/dL (ref 6–20)
CO2: 23 mmol/L (ref 22–32)
CREATININE: 0.51 mg/dL (ref 0.44–1.00)
Calcium: 8.6 mg/dL — ABNORMAL LOW (ref 8.9–10.3)
Chloride: 114 mmol/L — ABNORMAL HIGH (ref 101–111)
GFR calc Af Amer: >60 mL/min (ref >60)
GFR calc non Af Amer: >60 mL/min (ref >60)
GLUCOSE: 106 mg/dL — AB (ref 65–99)
Potassium: 3.8 mmol/L (ref 3.5–5.1)
Sodium: 143 mmol/L (ref 135–145)

## 2016-01-24 MED ORDER — CEPHALEXIN 500 MG PO CAPS: 500.0000 mg | ORAL_CAPSULE | Freq: Three times a day (TID) | ORAL | Status: DC

## 2016-01-24 NOTE — Progress Notes (Signed)
Removed pt IV, tolerated well.  Reviewed discharge instructions with pt and husband.  Answered questions at this time.

## 2016-01-24 NOTE — Discharge Summary (Signed)
Physician Discharge Summary  Misty [name] WUJ:[id number] DOB: 11/19/1940 DOA: 01/22/2016   Admit date: 01/22/2016 Discharge date: 01/24/2016  Discharge Diagnoses:  Principal Problem:   Sepsis (HCC) Active Problems:   UTI (urinary tract infection)    Wt Readings from Last 3 Encounters:  01/22/16 133 lb (60.328 kg)  01/19/16 135 lb (61.236 kg)  01/17/16 137 lb (62.143 kg)     Hospital Course:  This patient is a 75 year old female who presented with fever, low blood pressure, leukocytosis and generalized weakness. She was undergoing treatment for a Klebsiella UTI with Cipro. She denied vomiting. She was treated with ceftriaxone. Her leukocytosis resolved. Her fever resolved. Her strength improved. Blood cultures thus far have been negative. She was feeling better and requested discharge on July 17. Condition at discharge is much improved. She will be seen in follow-up in my office in one week. Antibiotic therapy will be continued with cephalexin in place of Cipro.  She was hypokalemic at 2.8 but normalized with supplemental therapy.  Exam at discharge reveals that she is in no distress. Lungs clear. Heart regular with no murmurs. Abdomen nontender with no hepatosplenomegaly. No CVA tenderness.  Lactic acid level was elevated. Repeat normalized. Pro-calcitonin was normal.   Discharge Instructions     Medication List    STOP taking these medications        ciprofloxacin 500 MG tablet  Commonly known as:  CIPRO     nitrofurantoin 100 MG capsule  Commonly known as:  MACRODANTIN      TAKE these medications        aspirin EC 81 MG tablet  Take 81 mg by mouth daily as needed for mild pain. Reported on 01/19/2016     bismuth subsalicylate 262 MG/15ML suspension  Commonly known as:  PEPTO BISMOL  Take 15 mLs by mouth every 6 (six) hours as needed for indigestion. Reported on 01/19/2016     cephALEXin 500 MG capsule  Commonly known as:  KEFLEX  Take 1 capsule (500 mg  total) by mouth 3 (three) times daily.     fluticasone 50 MCG/ACT nasal spray  Commonly known as:  FLONASE  Place 2 sprays into both nostrils daily as needed for allergies.     ketotifen 0.025 % ophthalmic solution  Commonly known as:  ZADITOR  Place 1 drop into both eyes 2 (two) times daily as needed (dry eyes).     LORazepam 0.5 MG tablet  Commonly known as:  ATIVAN  Take 0.25 mg by mouth at bedtime.     meclizine 25 MG tablet  Commonly known as:  ANTIVERT  Take 25 mg by mouth 3 (three) times daily as needed for dizziness.     meloxicam 7.5 MG tablet  Commonly known as:  MOBIC  Take 7.5 mg by mouth daily as needed for pain.     SYNTHROID 112 MCG tablet  Generic drug:  levothyroxine  Take 112 mcg by mouth daily. Patient has to have Brand Name Drug     URIBEL PO  Take 1 tablet by mouth 2 (two) times daily.         Misty [name] 01/24/2016

## 2016-01-25 LAB — URINE CULTURE: Culture: 1000 — AB

## 2016-01-27 LAB — CULTURE, BLOOD (ROUTINE X 2)
CULTURE: NO GROWTH
Culture: NO GROWTH

## 2016-01-28 ENCOUNTER — Ambulatory Visit: Payer: Medicare Other

## 2016-02-16 ENCOUNTER — Ambulatory Visit (HOSPITAL_COMMUNITY): Payer: Medicare Other

## 2016-02-23 ENCOUNTER — Ambulatory Visit (HOSPITAL_COMMUNITY)
Admission: RE | Admit: 2016-02-23 | Discharge: 2016-02-23 | Disposition: A | Discharge status: Home / Self Care | Payer: Medicare Other | Source: Ambulatory Visit | Attending: Internal Medicine | Admitting: Internal Medicine

## 2016-02-23 ENCOUNTER — Ambulatory Visit (INDEPENDENT_AMBULATORY_CARE_PROVIDER_SITE_OTHER): Payer: Medicare Other | Admitting: Obstetrics and Gynecology

## 2016-02-23 VITALS — BP 120/80 | Ht 62.5 in | Wt 134.0 lb

## 2016-02-23 DIAGNOSIS — N8111 Cystocele, midline: Secondary | ICD-10-CM | POA: Diagnosis not present

## 2016-02-23 DIAGNOSIS — R350 Frequency of micturition: Secondary | ICD-10-CM | POA: Diagnosis not present

## 2016-02-23 DIAGNOSIS — R3915 Urgency of urination: Secondary | ICD-10-CM | POA: Diagnosis not present

## 2016-02-23 DIAGNOSIS — Z1231 Encounter for screening mammogram for malignant neoplasm of breast: Secondary | ICD-10-CM | POA: Insufficient documentation

## 2016-02-23 DIAGNOSIS — R300 Dysuria: Secondary | ICD-10-CM

## 2016-02-23 NOTE — Progress Notes (Signed)
Patient ID: Misty [name], female   DOB: 04/25/1941, 75 y.o.   MRN: [id number]   [facility]  @DATE @            Patient name: Misty [name] MRN [id number]  Date of birth: 05/21/1941  CC & HPI:   Chief Complaint  Patient presents with  . Follow-up     Misty [name] is a 75 y.o. female presenting today for f/u of recurrent UTIs. Per pt, she continues to experience recurrent frequency, urgency and dysuria despite multiple treatments. Pt had normal cystoscopy on 02/16/16 with urologist Dr. Roena MaladyPulliam [facility]. Pt is currently taking qhs 400-80 mg Bactrim,Septra.   ROS:  Review of Systems  Genitourinary: Positive for dysuria, frequency and urgency.       Recurrent   Pertinent History Reviewed:   Reviewed: Significant for recurrent UTI, cystocele repair, rectocele repair   Medical         Past Medical History:  Diagnosis Date  . Anxiety   . Arthritis   . Chronic neck pain   . Equivocal stress test 2009   NS STTW changes  . GERD (gastroesophageal reflux disease)    refractory   . Hypertension   . Hypothyroidism   . IBS (irritable bowel syndrome)   . PONV (postoperative nausea and vomiting)   . Spinal headache    pt. states when she had rectocele repair she had a spinal & had a bad headache after                              Surgical Hx:    Past Surgical History:  Procedure Laterality Date  . ABDOMINAL HYSTERECTOMY  1985  . APPENDECTOMY  teenager  . bunoinectomy     left foot  . CHOLECYSTECTOMY  2009   Dr. Lovell SheehanJenkins  . COLONOSCOPY  12/26/2003   NWG:NFAOZHRMR:Normal rectum/Sessile, infiltrating, ulcerated process adjacent to the ileocecal valve suspicious for colonic neoplasm, biopsied multiple times/Shallow left-sided diverticula  . COLONOSCOPY  03/04/2004   YQM:VHQIONRMR:Normal rectum/Shallow left-sided diverticula/Previously noted ulcerated area in the right colon was gone/Aspirin and nonsteroidal anti-inflammatory drugs are well-known to produce stricture,  webs, and ulcerations throughout the upper gastrointestinal tract, small bowel, and even proximal colon  . CYSTOCELE REPAIR N/A 08/16/2012   Procedure: ANTERIOR REPAIR (CYSTOCELE);  Surgeon: Tilda BurrowJohn V Ayaan Ringle, MD;  Location: [facility];  Service: Gynecology;  Laterality: N/A;  . DILATION AND CURETTAGE OF UTERUS     x 2  . ESOPHAGOGASTRODUODENOSCOPY  08/16/2007   GEX:BMWUXLRMR:Normal esophagus, stomach, D1, D2.  . ESOPHAGOGASTRODUODENOSCOPY N/A 10/16/2013   KGM:WNUUVRMR:small HH/Abnormal gastric mucosa of doubtful clinical significance s/p bx  . RECTOCELE REPAIR    . TONSILLECTOMY  youth   Medications: Reviewed & Updated - see associated section                       Current Outpatient Prescriptions:  .  aspirin EC 81 MG tablet, Take 81 mg by mouth daily as needed for mild pain. Reported on 01/19/2016, Disp: , Rfl:  .  bismuth subsalicylate (PEPTO BISMOL) 262 MG/15ML suspension, Take 15 mLs by mouth every 6 (six) hours as needed for indigestion. Reported on 01/19/2016, Disp: , Rfl:  .  fluticasone (FLONASE) 50 MCG/ACT nasal spray, Place 2 sprays into both nostrils daily as needed for allergies. , Disp: , Rfl:  .  ketotifen (ZADITOR) 0.025 % ophthalmic solution, Place  1 drop into both eyes 2 (two) times daily as needed (dry eyes)., Disp: , Rfl:  .  Levothyroxine Sodium 112 MCG CAPS, Take by mouth., Disp: , Rfl:  .  LORazepam (ATIVAN) 0.5 MG tablet, Take 0.5 mg by mouth., Disp: , Rfl:  .  meclizine (ANTIVERT) 25 MG tablet, Take 25 mg by mouth 3 (three) times daily as needed for dizziness., Disp: , Rfl:  .  meloxicam (MOBIC) 7.5 MG tablet, Take 7.5 mg by mouth daily as needed for pain., Disp: , Rfl:  .  Meth-Hyo-M Bl-Na Phos-Ph Sal (URIBEL PO), Take 1 tablet by mouth 2 (two) times daily., Disp: , Rfl:  .  sulfamethoxazole-trimethoprim (BACTRIM,SEPTRA) 400-80 MG tablet, 1 tab po qhs, Disp: , Rfl:    Social History: Reviewed -  reports that she has never smoked. She has never used smokeless tobacco.  Objective Findings:   Vitals: Blood pressure 120/80, height 5' 2.5" (1.588 m), weight 134 lb (60.8 kg).  Physical Examination: General appearance - alert, well appearing, and in no distress Mental status - alert, oriented to person, place, and time Abdomen - soft, nontender, nondistended, no masses or organomegaly Pelvic -  VULVA: normal appearing vulva with no masses, tenderness or lesions,  VAGINA: normal appearing vagina with normal color and discharge, no lesions, Palpable 2 cm wide defect in the anterior upper vagina through which she has a bulging cystocele. Adequate apical support. Posterior support is adequate.  CERVIX: normal appearing cervix without discharge or lesions,  RECTAL: normal rectal, no masses, adequate support to the back wall Musculoskeletal - no joint tenderness, deformity or swelling Extremities - peripheral pulses normal, no pedal edema, no clubbing or cyanosis Skin - normal coloration and turgor, no rashes, no suspicious skin lesions noted  Discussed with pt risks and benefits of repeat anterior repair with cystoscopy. At end of discussion, pt had opportunity to ask questions and has no further questions at this time.   Greater than 50% was spent in counseling and coordination of care with the patient. Total time greater than: 45 minutes   Assessment & Plan:   A: recurrent cystocele 1. Recurrent UTI 2. Normal cystoscopy on 02/16/16  3. Recurrent cystocele  4. UA negative today  5. Measured RVR today in office 35 mL    P:  1. Collect urine culture today   2. Continue 400-80 mg Bactrim,Septra 3. F/u after 04/03/16 to pick a date for repeat anterior repair with cystoscopy    By signing my name below, I, Doreatha MartinEva Mathews, attest that this documentation has been prepared under the direction and in the presence of Tilda BurrowJohn V Torrie Namba, MD. Electronically Signed: Doreatha MartinEva Mathews, ED Scribe. 02/23/16. 9:04 AM.  I personally performed the services described in this documentation, which was SCRIBED  in my presence. The recorded information has been reviewed and considered accurate. It has been edited as necessary during review. Tilda BurrowFERGUSON,Domanick Cuccia V, MD

## 2016-02-25 ENCOUNTER — Ambulatory Visit (HOSPITAL_COMMUNITY): Payer: Medicare Other

## 2016-03-01 ENCOUNTER — Ambulatory Visit: Payer: Medicare Other | Admitting: Obstetrics and Gynecology

## 2016-03-08 ENCOUNTER — Encounter: Payer: Medicare Other | Admitting: Obstetrics and Gynecology

## 2016-04-13 ENCOUNTER — Ambulatory Visit: Payer: Medicare Other | Admitting: Obstetrics and Gynecology

## 2016-04-18 ENCOUNTER — Telehealth: Payer: Self-pay

## 2016-04-18 NOTE — Telephone Encounter (Signed)
Patient received a letter to be triaged for a colonoscopy. Please call [phone]

## 2016-04-26 NOTE — Telephone Encounter (Signed)
LMOM to call.

## 2016-05-08 NOTE — Telephone Encounter (Signed)
LMOM to call.

## 2016-05-09 NOTE — Telephone Encounter (Signed)
LMOM to call.

## 2016-05-09 NOTE — Telephone Encounter (Signed)
Could consider Cologuard testing but would advise ov to discuss.

## 2016-05-09 NOTE — Telephone Encounter (Signed)
[place] [facility] [place] [facility] [place] [facility] [place] [facility] [place], and she does not feel that she would be able to drink all of the solution for a prep.  She would like to know if we could do iFOBT or ColoGuard to check.  Please advise!

## 2016-05-10 NOTE — Telephone Encounter (Signed)
PT is scheduled for OV with Tana CoastLeslie Lewis, PA on 05/26/2016 at 10:30 Am.

## 2016-05-26 ENCOUNTER — Ambulatory Visit: Payer: Medicare Other | Admitting: Gastroenterology

## 2016-09-20 ENCOUNTER — Emergency Department (HOSPITAL_COMMUNITY)
Admission: EM | Admit: 2016-09-20 | Discharge: 2016-09-20 | Disposition: A | Discharge status: Home / Self Care | Payer: Medicare Other | Attending: Emergency Medicine | Admitting: Emergency Medicine

## 2016-09-20 ENCOUNTER — Encounter (HOSPITAL_COMMUNITY): Payer: Self-pay | Admitting: Emergency Medicine

## 2016-09-20 DIAGNOSIS — R300 Dysuria: Secondary | ICD-10-CM

## 2016-09-20 DIAGNOSIS — N390 Urinary tract infection, site not specified: Secondary | ICD-10-CM | POA: Insufficient documentation

## 2016-09-20 DIAGNOSIS — E039 Hypothyroidism, unspecified: Secondary | ICD-10-CM | POA: Insufficient documentation

## 2016-09-20 DIAGNOSIS — I10 Essential (primary) hypertension: Secondary | ICD-10-CM | POA: Diagnosis not present

## 2016-09-20 DIAGNOSIS — Z7982 Long term (current) use of aspirin: Secondary | ICD-10-CM | POA: Diagnosis not present

## 2016-09-20 HISTORY — DX: Urinary tract infection, site not specified: N39.0

## 2016-09-20 LAB — CBC WITH DIFFERENTIAL/PLATELET
BASOS ABS: 0 10*3/uL (ref 0.0–0.1)
BASOS PCT: 0 %
EOS ABS: 0 10*3/uL (ref 0.0–0.7)
Eosinophils Relative: 0 %
HCT: 40 % (ref 36.0–46.0)
HEMOGLOBIN: 14 g/dL (ref 12.0–15.0)
Lymphocytes Relative: 3 %
Lymphs Abs: 0.4 10*3/uL — ABNORMAL LOW (ref 0.7–4.0)
MCH: 35.4 pg — ABNORMAL HIGH (ref 26.0–34.0)
MCHC: 35 g/dL (ref 30.0–36.0)
MCV: 101 fL — ABNORMAL HIGH (ref 78.0–100.0)
Monocytes Absolute: 0.3 10*3/uL (ref 0.1–1.0)
Monocytes Relative: 3 %
NEUTROS PCT: 94 %
Neutro Abs: 11.9 10*3/uL — ABNORMAL HIGH (ref 1.7–7.7)
Platelets: 190 10*3/uL (ref 150–400)
RBC: 3.96 MIL/uL (ref 3.87–5.11)
RDW: 12 % (ref 11.5–15.5)
WBC: 12.6 10*3/uL — AB (ref 4.0–10.5)

## 2016-09-20 LAB — BASIC METABOLIC PANEL
ANION GAP: 7 (ref 5–15)
BUN: 19 mg/dL (ref 6–20)
CALCIUM: 9.1 mg/dL (ref 8.9–10.3)
CHLORIDE: 98 mmol/L — AB (ref 101–111)
CO2: 27 mmol/L (ref 22–32)
CREATININE: 0.98 mg/dL (ref 0.44–1.00)
GFR calc non Af Amer: 55 mL/min — ABNORMAL LOW (ref >60)
Glucose, Bld: 134 mg/dL — ABNORMAL HIGH (ref 65–99)
Potassium: 3.6 mmol/L (ref 3.5–5.1)
SODIUM: 132 mmol/L — AB (ref 135–145)

## 2016-09-20 LAB — URINALYSIS, ROUTINE W REFLEX MICROSCOPIC
BILIRUBIN URINE: NEGATIVE
Glucose, UA: NEGATIVE mg/dL
Hgb urine dipstick: NEGATIVE
KETONES UR: 5 mg/dL — AB
LEUKOCYTES UA: NEGATIVE
Nitrite: NEGATIVE
PH: 5 (ref 5.0–8.0)
PROTEIN: 30 mg/dL — AB
Specific Gravity, Urine: 1.021 (ref 1.005–1.030)

## 2016-09-20 MED ORDER — CEPHALEXIN 500 MG PO CAPS
500.0000 mg | ORAL_CAPSULE | Freq: Three times a day (TID) | ORAL | 0 refills | Status: AC
Start: 2016-09-20 — End: ?

## 2016-09-20 MED ORDER — CEPHALEXIN 500 MG PO CAPS
500.0000 mg | ORAL_CAPSULE | Freq: Once | ORAL | Status: AC
  Administered 2016-09-20: 500 mg via ORAL
  Filled 2016-09-20: qty 1

## 2016-09-20 NOTE — Discharge Instructions (Signed)
Please start antibiotics for potential UTI. Follow-up with your uro-gyn doctor.  Return for worsening symptoms, including fever, severe abdominal or back pain, intractable vomiting or any other symptoms concerning ot you.

## 2016-09-20 NOTE — ED Provider Notes (Signed)
[facility]-EM[facility] DEPT Provider Note   CSN: [id number] Arrival date & time: 09/20/16  1708     History   Chief Complaint Chief Complaint  Patient presents with  . Dysuria    HPI Misty [name] is a 76 y.o. female.  The history is provided by the patient.  Dysuria   This is a new problem. The current episode started yesterday. The problem occurs every urination. The problem has not changed since onset.The quality of the pain is described as burning. The pain is mild. There has been no fever. Associated symptoms include nausea, vomiting, frequency, hesitancy and urgency. Pertinent negatives include no hematuria and no flank pain. She has tried antibiotics for the symptoms. Her past medical history is significant for recurrent UTIs.   History of cystocele c/b recurrent UTIs. One day ago with dysuria, frequency, urgency. Associated with low back pain and one episode of vomiting this morning. No abdominal pain, confusion, flank tenderness.  Past Medical History:  Diagnosis Date  . Anxiety   . Arthritis   . Chronic neck pain   . Chronic UTI   . Equivocal stress test 2009   NS STTW changes  . GERD (gastroesophageal reflux disease)    refractory   . Hypertension   . Hypothyroidism   . IBS (irritable bowel syndrome)   . PONV (postoperative nausea and vomiting)   . Spinal headache    pt. states when she had rectocele repair she had a spinal & had a bad headache after    Patient Active Problem List   Diagnosis Date Noted  . Sepsis (HCC) 01/22/2016  . UTI (urinary tract infection) 01/22/2016  . UTI symptoms 01/12/2016  . Chest pain 08/16/2013  . GERD (gastroesophageal reflux disease) 01/08/2013  . Cystocele, midline 08/16/2012    Past Surgical History:  Procedure Laterality Date  . ABDOMINAL HYSTERECTOMY  1985  . APPENDECTOMY  teenager  . bunoinectomy     left foot  . CHOLECYSTECTOMY  2009   Dr. Lovell Sheehan  . COLONOSCOPY  12/26/2003   JXB:JYNWGN rectum/Sessile,  infiltrating, ulcerated process adjacent to the ileocecal valve suspicious for colonic neoplasm, biopsied multiple times/Shallow left-sided diverticula  . COLONOSCOPY  03/04/2004   FAO:ZHYQMV rectum/Shallow left-sided diverticula/Previously noted ulcerated area in the right colon was gone/Aspirin and nonsteroidal anti-inflammatory drugs are well-known to produce stricture, webs, and ulcerations throughout the upper gastrointestinal tract, small bowel, and even proximal colon  . CYSTOCELE REPAIR N/A 08/16/2012   Procedure: ANTERIOR REPAIR (CYSTOCELE);  Surgeon: Tilda Burrow, MD;  Location: [facility];  Service: Gynecology;  Laterality: N/A;  . DILATION AND CURETTAGE OF UTERUS     x 2  . ESOPHAGOGASTRODUODENOSCOPY  08/16/2007   HQI:ONGEXB esophagus, stomach, D1, D2.  . ESOPHAGOGASTRODUODENOSCOPY N/A 10/16/2013   MWU:XLKGM HH/Abnormal gastric mucosa of doubtful clinical significance s/p bx  . RECTOCELE REPAIR    . TONSILLECTOMY  youth    OB History    No data available       Home Medications    Prior to Admission medications   Medication Sig Start Date End Date Taking? Authorizing Provider  aspirin EC 81 MG tablet Take 81 mg by mouth daily as needed for mild pain. Reported on 01/19/2016    Historical Provider, MD  bismuth subsalicylate (PEPTO BISMOL) 262 MG/15ML suspension Take 15 mLs by mouth every 6 (six) hours as needed for indigestion. Reported on 01/19/2016    Historical Provider, MD  cephALEXin (KEFLEX) 500 MG capsule Take 1 capsule (500 mg total) by  mouth 3 (three) times daily. 09/20/16   Lavera Guiseana Duo Sander Remedios, MD  fluticasone (FLONASE) 50 MCG/ACT nasal spray Place 2 sprays into both nostrils daily as needed for allergies.  10/27/15   Historical Provider, MD  ketotifen (ZADITOR) 0.025 % ophthalmic solution Place 1 drop into both eyes 2 (two) times daily as needed (dry eyes).    Historical Provider, MD  Levothyroxine Sodium 112 MCG CAPS Take by mouth.    Historical Provider, MD  LORazepam (ATIVAN)  0.5 MG tablet Take 0.5 mg by mouth.    Historical Provider, MD  meclizine (ANTIVERT) 25 MG tablet Take 25 mg by mouth 3 (three) times daily as needed for dizziness.    Historical Provider, MD  meloxicam (MOBIC) 7.5 MG tablet Take 7.5 mg by mouth daily as needed for pain.    Historical Provider, MD  Meth-Hyo-M Bl-Na Phos-Ph Sal (URIBEL PO) Take 1 tablet by mouth 2 (two) times daily.    Historical Provider, MD  sulfamethoxazole-trimethoprim (BACTRIM,SEPTRA) 400-80 MG tablet 1 tab po qhs 02/04/16   Historical Provider, MD    Family History Family History  Problem Relation Age of Onset  . Hypertension Mother   . Cancer Father     lung  . Hypertension Son   . Colon cancer Neg Hx     Social History Social History  Substance Use Topics  . Smoking status: Never Smoker  . Smokeless tobacco: Never Used  . Alcohol use 0.6 oz/week    1 Glasses of wine per week     Comment: social, rare wine     Allergies   Amoxicillin; Asa [aspirin]; Demerol [meperidine]; Vicodin [hydrocodone-acetaminophen]; and Tramadol   Review of Systems Review of Systems  Constitutional: Negative for fever.  HENT: Negative for congestion.   Respiratory: Negative for shortness of breath.   Cardiovascular: Negative for chest pain.  Gastrointestinal: Positive for nausea and vomiting.  Genitourinary: Positive for dysuria, frequency, hesitancy and urgency. Negative for flank pain and hematuria.  Musculoskeletal: Positive for back pain.  Allergic/Immunologic: Negative for immunocompromised state.  Neurological: Negative for syncope.  Hematological: Does not bruise/bleed easily.  Psychiatric/Behavioral: Negative for confusion.  All other systems reviewed and are negative.    Physical Exam Updated Vital Signs BP 112/64 (BP Location: Left Arm)   Pulse 101   Temp 98.7 F (37.1 C) (Oral)   Resp 18   Ht 5\' 2"  (1.575 m)   Wt 135 lb (61.2 kg)   SpO2 97%   BMI 24.69 kg/m   Physical Exam Physical Exam  Nursing  note and vitals reviewed. Constitutional: Well developed, well nourished, non-toxic, and in no acute distress Head: Normocephalic and atraumatic.  Mouth/Throat: Oropharynx is clear and moist.  Neck: Normal range of motion. Neck supple.  Cardiovascular: Normal rate and regular rhythm.   Pulmonary/Chest: Effort normal and breath sounds normal.  Abdominal: Soft. There is no tenderness. There is no rebound and no guarding. no CVA tenderness Musculoskeletal: Normal range of motion.  Neurological: Alert, no facial droop, fluent speech, moves all extremities symmetrically Skin: Skin is warm and dry.  Psychiatric: Cooperative   ED Treatments / Results  Labs (all labs ordered are listed, but only abnormal results are displayed) Labs Reviewed  URINALYSIS, ROUTINE W REFLEX MICROSCOPIC - Abnormal; Notable for the following:       Result Value   Color, Urine AMBER (*)    APPearance HAZY (*)    Ketones, ur 5 (*)    Protein, ur 30 (*)    Bacteria,  UA RARE (*)    All other components within normal limits  CBC WITH DIFFERENTIAL/PLATELET - Abnormal; Notable for the following:    WBC 12.6 (*)    MCV 101.0 (*)    MCH 35.4 (*)    Neutro Abs 11.9 (*)    Lymphs Abs 0.4 (*)    All other components within normal limits  BASIC METABOLIC PANEL - Abnormal; Notable for the following:    Sodium 132 (*)    Chloride 98 (*)    Glucose, Bld 134 (*)    GFR calc non Af Amer 55 (*)    All other components within normal limits  URINE CULTURE    EKG  EKG Interpretation None       Radiology No results found.  Procedures Procedures (including critical care time)  Medications Ordered in ED Medications  cephALEXin (KEFLEX) capsule 500 mg (500 mg Oral Given 09/20/16 1820)     Initial Impression / Assessment and Plan / ED Course  I have reviewed the triage vital signs and the nursing notes.  Pertinent labs & imaging results that were available during my care of the patient were reviewed by me and  considered in my medical decision making (see chart for details).     Frequent UTI history w/ dysuria, frequency urgency for one day similar to prior UTI. This time with episode of vomiting after taking dose of bactrim this morning and low back pain but no CVA tenderness or abdominal pain. Does not seem consistent with pyelonephritis. Vital stable. She is well appearing.no signs of systemic illness. Felt appropriate for outpatient management.   I reviewed the available records in [facility] and also Care Everywhere, updated the chart. She has had prior urine culture is growing Escherichia coli that has been resistant to Bactrim which she is taking. Also has grown Klebsiella in the past with good unsteady to most antibiotics. She has tolerated Keflex well in the past, and both of her prior cultures is shown sensitivity to this antibiotic. Will start on a course of Keflex. She will follow-up with her Uro-gynecologist.   Final Clinical Impressions(s) / ED Diagnoses   Final diagnoses:  Dysuria  Lower urinary tract infectious disease    New Prescriptions New Prescriptions   CEPHALEXIN (KEFLEX) 500 MG CAPSULE    Take 1 capsule (500 mg total) by mouth 3 (three) times daily.     Lavera Guise, MD 09/20/16 [phone]

## 2016-09-20 NOTE — ED Triage Notes (Signed)
Pt reports urinary frequency, urgency, and burning since yesterday with hx of same.  Nausea with vomiting x1 this morning.

## 2016-09-22 LAB — URINE CULTURE: Culture: NO GROWTH

## 2017-01-12 ENCOUNTER — Other Ambulatory Visit (HOSPITAL_COMMUNITY): Payer: Self-pay | Admitting: Internal Medicine

## 2017-01-12 DIAGNOSIS — Z1231 Encounter for screening mammogram for malignant neoplasm of breast: Secondary | ICD-10-CM

## 2017-02-26 ENCOUNTER — Ambulatory Visit (HOSPITAL_COMMUNITY)
Admission: RE | Admit: 2017-02-26 | Discharge: 2017-02-26 | Disposition: A | Discharge status: Home / Self Care | Payer: Medicare Other | Source: Ambulatory Visit | Attending: Internal Medicine | Admitting: Internal Medicine

## 2017-02-26 DIAGNOSIS — Z1231 Encounter for screening mammogram for malignant neoplasm of breast: Secondary | ICD-10-CM | POA: Diagnosis not present

## 2017-03-28 ENCOUNTER — Other Ambulatory Visit (HOSPITAL_COMMUNITY): Payer: Self-pay | Admitting: Internal Medicine

## 2017-03-28 DIAGNOSIS — R413 Other amnesia: Secondary | ICD-10-CM

## 2017-04-04 ENCOUNTER — Ambulatory Visit (HOSPITAL_COMMUNITY)
Admission: RE | Admit: 2017-04-04 | Discharge: 2017-04-04 | Disposition: A | Discharge status: Home / Self Care | Payer: Medicare Other | Source: Ambulatory Visit | Attending: Internal Medicine | Admitting: Internal Medicine

## 2017-04-04 DIAGNOSIS — R413 Other amnesia: Secondary | ICD-10-CM

## 2017-04-04 DIAGNOSIS — I6782 Cerebral ischemia: Secondary | ICD-10-CM | POA: Diagnosis not present

## 2017-04-04 DIAGNOSIS — G319 Degenerative disease of nervous system, unspecified: Secondary | ICD-10-CM | POA: Diagnosis not present

## 2018-03-21 ENCOUNTER — Emergency Department (HOSPITAL_COMMUNITY)
Admission: EM | Admit: 2018-03-21 | Discharge: 2018-03-21 | Disposition: A | Discharge status: Home / Self Care | Payer: Medicare Other | Attending: Emergency Medicine | Admitting: Emergency Medicine

## 2018-03-21 ENCOUNTER — Other Ambulatory Visit: Payer: Self-pay

## 2018-03-21 ENCOUNTER — Encounter (HOSPITAL_COMMUNITY): Payer: Self-pay | Admitting: Emergency Medicine

## 2018-03-21 DIAGNOSIS — Z79899 Other long term (current) drug therapy: Secondary | ICD-10-CM | POA: Diagnosis not present

## 2018-03-21 DIAGNOSIS — L03211 Cellulitis of face: Secondary | ICD-10-CM

## 2018-03-21 DIAGNOSIS — E039 Hypothyroidism, unspecified: Secondary | ICD-10-CM | POA: Insufficient documentation

## 2018-03-21 DIAGNOSIS — I10 Essential (primary) hypertension: Secondary | ICD-10-CM | POA: Diagnosis not present

## 2018-03-21 DIAGNOSIS — R21 Rash and other nonspecific skin eruption: Secondary | ICD-10-CM | POA: Diagnosis present

## 2018-03-21 LAB — CBC WITH DIFFERENTIAL/PLATELET
BASOS PCT: 0 %
Basophils Absolute: 0 10*3/uL (ref 0.0–0.1)
EOS ABS: 0.1 10*3/uL (ref 0.0–0.7)
EOS PCT: 1 %
HCT: 42.8 % (ref 36.0–46.0)
HEMOGLOBIN: 14.3 g/dL (ref 12.0–15.0)
LYMPHS ABS: 2.5 10*3/uL (ref 0.7–4.0)
Lymphocytes Relative: 24 %
MCH: 34.8 pg — AB (ref 26.0–34.0)
MCHC: 33.4 g/dL (ref 30.0–36.0)
MCV: 104.1 fL — ABNORMAL HIGH (ref 78.0–100.0)
MONO ABS: 1.3 10*3/uL — AB (ref 0.1–1.0)
MONOS PCT: 12 %
NEUTROS PCT: 63 %
Neutro Abs: 6.6 10*3/uL (ref 1.7–7.7)
PLATELETS: 213 10*3/uL (ref 150–400)
RBC: 4.11 MIL/uL (ref 3.87–5.11)
RDW: 12.5 % (ref 11.5–15.5)
WBC: 10.5 10*3/uL (ref 4.0–10.5)

## 2018-03-21 LAB — COMPREHENSIVE METABOLIC PANEL
ALT: 32 U/L (ref 0–44)
ANION GAP: 11 (ref 5–15)
AST: 26 U/L (ref 15–41)
Albumin: 4 g/dL (ref 3.5–5.0)
Alkaline Phosphatase: 74 U/L (ref 38–126)
BILIRUBIN TOTAL: 0.6 mg/dL (ref 0.3–1.2)
BUN: 31 mg/dL — ABNORMAL HIGH (ref 8–23)
CO2: 24 mmol/L (ref 22–32)
Calcium: 9.4 mg/dL (ref 8.9–10.3)
Chloride: 106 mmol/L (ref 98–111)
Creatinine, Ser: 0.78 mg/dL (ref 0.44–1.00)
Glucose, Bld: 104 mg/dL — ABNORMAL HIGH (ref 70–99)
POTASSIUM: 4.7 mmol/L (ref 3.5–5.1)
Sodium: 141 mmol/L (ref 135–145)
TOTAL PROTEIN: 7.3 g/dL (ref 6.5–8.1)

## 2018-03-21 MED ORDER — ONDANSETRON 4 MG PO TBDP: 4.0000 mg | ORAL_TABLET | Freq: Three times a day (TID) | ORAL | 0 refills | Status: AC | PRN

## 2018-03-21 MED ORDER — DOXYCYCLINE HYCLATE 100 MG PO CAPS: 100.0000 mg | ORAL_CAPSULE | Freq: Two times a day (BID) | ORAL | 0 refills | Status: AC

## 2018-03-21 NOTE — Discharge Instructions (Signed)
See Dr. Ouida SillsFagan for recheck tomorrow

## 2018-03-21 NOTE — ED Triage Notes (Signed)
PT c/o burning, redness to face that developed on this past Monday. Pt stated she saw her PCP yesterday and was told to come to ED if no improvement. PT states the dermatologist unable to see her until Tuesday. PT denies any new creams, soaps or medications.

## 2018-03-21 NOTE — ED Provider Notes (Signed)
[facility] EMERGENCY DEPARTMENT Provider Note   CSN: [id number] Arrival date & time: 03/21/18  1339     History   Chief Complaint Chief Complaint  Patient presents with  . Skin Problem    HPI MELEA PREZIOSO is a 77 y.o. female.  The history is provided by the patient. No language interpreter was used.  Rash   This is a new problem. The current episode started more than 2 days ago. The problem has not changed since onset.The problem is associated with nothing. There has been no fever. The rash is present on the face. The pain is moderate. The pain has been constant since onset. Associated symptoms include pain. She has tried nothing for the symptoms. The treatment provided moderate relief.  Pt complains of redness to her face. Pt reports redness began Monday night.  Pt denies cosmetics or new medications.  Pt reports she saw Dr. Ouida Sills yesterday who advised her to come here Past Medical History:  Diagnosis Date  . Anxiety   . Arthritis   . Chronic neck pain   . Chronic UTI   . Equivocal stress test 2009   NS STTW changes  . GERD (gastroesophageal reflux disease)    refractory   . Hypertension   . Hypothyroidism   . IBS (irritable bowel syndrome)   . PONV (postoperative nausea and vomiting)   . Spinal headache    pt. states when she had rectocele repair she had a spinal & had a bad headache after    Patient Active Problem List   Diagnosis Date Noted  . Sepsis (HCC) 01/22/2016  . UTI (urinary tract infection) 01/22/2016  . UTI symptoms 01/12/2016  . Chest pain 08/16/2013  . GERD (gastroesophageal reflux disease) 01/08/2013  . Cystocele, midline 08/16/2012    Past Surgical History:  Procedure Laterality Date  . ABDOMINAL HYSTERECTOMY  1985  . APPENDECTOMY  teenager  . bunoinectomy     left foot  . CHOLECYSTECTOMY  2009   Dr. Lovell Sheehan  . COLONOSCOPY  12/26/2003   WUJ:WJXBJY rectum/Sessile, infiltrating, ulcerated process adjacent to the ileocecal valve  suspicious for colonic neoplasm, biopsied multiple times/Shallow left-sided diverticula  . COLONOSCOPY  03/04/2004   NWG:NFAOZH rectum/Shallow left-sided diverticula/Previously noted ulcerated area in the right colon was gone/Aspirin and nonsteroidal anti-inflammatory drugs are well-known to produce stricture, webs, and ulcerations throughout the upper gastrointestinal tract, small bowel, and even proximal colon  . CYSTOCELE REPAIR N/A 08/16/2012   Procedure: ANTERIOR REPAIR (CYSTOCELE);  Surgeon: Tilda Burrow, MD;  Location: [facility];  Service: Gynecology;  Laterality: N/A;  . DILATION AND CURETTAGE OF UTERUS     x 2  . ESOPHAGOGASTRODUODENOSCOPY  08/16/2007   YQM:VHQION esophagus, stomach, D1, D2.  . ESOPHAGOGASTRODUODENOSCOPY N/A 10/16/2013   GEX:BMWUX HH/Abnormal gastric mucosa of doubtful clinical significance s/p bx  . RECTOCELE REPAIR    . TONSILLECTOMY  youth     OB History   None      Home Medications    Prior to Admission medications   Medication Sig Start Date End Date Taking? Authorizing Provider  aspirin EC 81 MG tablet Take 81 mg by mouth daily as needed for mild pain. Reported on 01/19/2016    [provider]  bismuth subsalicylate (PEPTO BISMOL) 262 MG/15ML suspension Take 15 mLs by mouth every 6 (six) hours as needed for indigestion. Reported on 01/19/2016    [provider]  cephALEXin (KEFLEX) 500 MG capsule Take 1 capsule (500 mg total) by mouth 3 (  three) times daily. 09/20/16   Lavera Guise, MD  fluticasone [facility]) 50 MCG/ACT nasal spray Place 2 sprays into both nostrils daily as needed for allergies.  10/27/15   [provider]  ketotifen (ZADITOR) 0.025 % ophthalmic solution Place 1 drop into both eyes 2 (two) times daily as needed (dry eyes).    [provider]  Levothyroxine Sodium 112 MCG CAPS Take by mouth.    [provider]  LORazepam (ATIVAN) 0.5 MG tablet Take 0.5 mg by mouth.    [provider]    meclizine (ANTIVERT) 25 MG tablet Take 25 mg by mouth 3 (three) times daily as needed for dizziness.    [provider]  meloxicam (MOBIC) 7.5 MG tablet Take 7.5 mg by mouth daily as needed for pain.    [provider]  Meth-Hyo-M Bl-Na Phos-Ph Sal (URIBEL PO) Take 1 tablet by mouth 2 (two) times daily.    [provider]  sulfamethoxazole-trimethoprim (BACTRIM,SEPTRA) 400-80 MG tablet 1 tab po qhs 02/04/16   [provider]    Family History Family History  Problem Relation Age of Onset  . Hypertension Mother   . Cancer Father        lung  . Hypertension Son   . Colon cancer Neg Hx     Social History Social History   Tobacco Use  . Smoking status: Never Smoker  . Smokeless tobacco: Never Used  Substance Use Topics  . Alcohol use: Yes    Alcohol/week: 1.0 standard drinks    Types: 1 Glasses of wine per week    Comment: social, rare wine  . Drug use: No     Allergies   Amoxicillin; Asa [aspirin]; Demerol [meperidine]; Vicodin [hydrocodone-acetaminophen]; and Tramadol   Review of Systems Review of Systems  Skin: Positive for color change and rash.  All other systems reviewed and are negative.    Physical Exam Updated Vital Signs BP (!) 144/71 (BP Location: Right Arm)   Pulse 90   Temp 97.9 F (36.6 C) (Oral)   Resp 14   Ht 5\' 1"  (1.549 m)   Wt 62.1 kg   SpO2 99%   BMI 25.89 kg/m   Physical Exam  Constitutional: She is oriented to person, place, and time. She appears well-developed and well-nourished.  HENT:  Head: Normocephalic.  Face bright red, erythematous cheeks,  Erythema from upper neck to top of eyelids.    Eyes: Pupils are equal, round, and reactive to light. Conjunctivae and EOM are normal.  Neck: Normal range of motion.  Cardiovascular: Normal rate.  Pulmonary/Chest: Effort normal.  Abdominal: She exhibits no distension.  Musculoskeletal: Normal range of motion.  Neurological: She is alert and oriented to  person, place, and time.  Psychiatric: She has a normal mood and affect.  Nursing note and vitals reviewed.    ED Treatments / Results  Labs (all labs ordered are listed, but only abnormal results are displayed) Labs Reviewed  CBC WITH DIFFERENTIAL/PLATELET - Abnormal; Notable for the following components:      Result Value   MCV 104.1 (*)    MCH 34.8 (*)    Monocytes Absolute 1.3 (*)    All other components within normal limits  COMPREHENSIVE METABOLIC PANEL - Abnormal; Notable for the following components:   Glucose, Bld 104 (*)    BUN 31 (*)    All other components within normal limits    EKG None  Radiology No results found.  Procedures Procedures (including critical  care time)  Medications Ordered in ED Medications - No data to display   Initial Impression / Assessment and Plan / ED Course  I have reviewed the triage vital signs and the nursing notes.  Pertinent labs & imaging results that were available during my care of the patient were reviewed by me and considered in my medical decision making (see chart for details).    MDM  Dr. Adriana Simasook in to see and examine.  Possible cellulitis.   I sill treat pt with antibiotics.   Pt advised to recheck with her Md tomorrow   Final Clinical Impressions(s) / ED Diagnoses   Final diagnoses:  Cellulitis, face    ED Discharge Orders         Ordered    doxycycline (VIBRAMYCIN) 100 MG capsule  2 times daily     03/21/18 1645        An After Visit Summary was printed and given to the patient.    Elson AreasSofia, Leslie K, [place] JerseyPA-C 03/21/18 1646    Donnetta Hutchingook, Brian, MD 03/23/18 1124

## 2018-03-22 ENCOUNTER — Emergency Department (HOSPITAL_COMMUNITY)
Admission: EM | Admit: 2018-03-22 | Discharge: 2018-03-22 | Disposition: A | Discharge status: Home / Self Care | Payer: Medicare Other | Attending: Emergency Medicine | Admitting: Emergency Medicine

## 2018-03-22 ENCOUNTER — Encounter (HOSPITAL_COMMUNITY): Payer: Self-pay

## 2018-03-22 ENCOUNTER — Other Ambulatory Visit: Payer: Self-pay

## 2018-03-22 DIAGNOSIS — R21 Rash and other nonspecific skin eruption: Secondary | ICD-10-CM | POA: Diagnosis present

## 2018-03-22 DIAGNOSIS — I10 Essential (primary) hypertension: Secondary | ICD-10-CM | POA: Diagnosis not present

## 2018-03-22 DIAGNOSIS — Z79899 Other long term (current) drug therapy: Secondary | ICD-10-CM | POA: Insufficient documentation

## 2018-03-22 DIAGNOSIS — Z7982 Long term (current) use of aspirin: Secondary | ICD-10-CM | POA: Insufficient documentation

## 2018-03-22 DIAGNOSIS — E039 Hypothyroidism, unspecified: Secondary | ICD-10-CM | POA: Insufficient documentation

## 2018-03-22 DIAGNOSIS — L03211 Cellulitis of face: Secondary | ICD-10-CM | POA: Diagnosis not present

## 2018-03-22 MED ORDER — CEFTRIAXONE SODIUM 1 G IJ SOLR
1.0000 g | Freq: Once | INTRAMUSCULAR | Status: AC
  Administered 2018-03-22: 1 g via INTRAMUSCULAR
  Filled 2018-03-22: qty 10

## 2018-03-22 MED ORDER — LIDOCAINE HCL (PF) 1 % IJ SOLN
INTRAMUSCULAR | Status: AC
  Filled 2018-03-22: qty 2

## 2018-03-22 NOTE — ED Provider Notes (Signed)
[facility] EMERGENCY DEPARTMENT Provider Note   CSN: [id number] Arrival date & time: 03/22/18  0119     History   Chief Complaint Chief Complaint  Patient presents with  . Cellulitis    face    HPI Misty [name] is a 77 y.o. female.  Patient is a 77 year old female presenting with complaints of rash to the face.  She states that this started yesterday and is worsening.  She was seen here several hours ago and prescribed doxycycline.  She returns because she is not improving and actually believes it may be worse.  She denies any difficulty breathing or swallowing.  She denies any fevers or chills.  She denies any injury or trauma and denies having been bitten by any insects.  The history is provided by the patient.    Past Medical History:  Diagnosis Date  . Anxiety   . Arthritis   . Chronic neck pain   . Chronic UTI   . Equivocal stress test 2009   NS STTW changes  . GERD (gastroesophageal reflux disease)    refractory   . Hypertension   . Hypothyroidism   . IBS (irritable bowel syndrome)   . PONV (postoperative nausea and vomiting)   . Spinal headache    pt. states when she had rectocele repair she had a spinal & had a bad headache after    Patient Active Problem List   Diagnosis Date Noted  . Sepsis (HCC) 01/22/2016  . UTI (urinary tract infection) 01/22/2016  . UTI symptoms 01/12/2016  . Chest pain 08/16/2013  . GERD (gastroesophageal reflux disease) 01/08/2013  . Cystocele, midline 08/16/2012    Past Surgical History:  Procedure Laterality Date  . ABDOMINAL HYSTERECTOMY  1985  . APPENDECTOMY  teenager  . bunoinectomy     left foot  . CHOLECYSTECTOMY  2009   Dr. Lovell Sheehan  . COLONOSCOPY  12/26/2003   LKG:MWNUUV rectum/Sessile, infiltrating, ulcerated process adjacent to the ileocecal valve suspicious for colonic neoplasm, biopsied multiple times/Shallow left-sided diverticula  . COLONOSCOPY  03/04/2004   OZD:GUYQIH rectum/Shallow left-sided  diverticula/Previously noted ulcerated area in the right colon was gone/Aspirin and nonsteroidal anti-inflammatory drugs are well-known to produce stricture, webs, and ulcerations throughout the upper gastrointestinal tract, small bowel, and even proximal colon  . CYSTOCELE REPAIR N/A 08/16/2012   Procedure: ANTERIOR REPAIR (CYSTOCELE);  Surgeon: Tilda Burrow, MD;  Location: [facility] OR[facility];  Service: Gynecology;  Laterality: N/A;  . DILATION AND CURETTAGE OF UTERUS     x 2  . ESOPHAGOGASTRODUODENOSCOPY  08/16/2007   KVQ:QVZDGL esophagus, stomach, D1, D2.  . ESOPHAGOGASTRODUODENOSCOPY N/A 10/16/2013   OVF:IEPPI HH/Abnormal gastric mucosa of doubtful clinical significance s/p bx  . RECTOCELE REPAIR    . TONSILLECTOMY  youth     OB History   None      Home Medications    Prior to Admission medications   Medication Sig Start Date End Date Taking? Authorizing Provider  aspirin EC 81 MG tablet Take 81 mg by mouth daily as needed for mild pain. Reported on 01/19/2016    [provider]  bismuth subsalicylate (PEPTO BISMOL) 262 MG/15ML suspension Take 15 mLs by mouth every 6 (six) hours as needed for indigestion. Reported on 01/19/2016    [provider]  cephALEXin (KEFLEX) 500 MG capsule Take 1 capsule (500 mg total) by mouth 3 (three) times daily. 09/20/16   Lavera Guise, MD  doxycycline (VIBRAMYCIN) 100 MG capsule Take 1 capsule (100 mg total) by  mouth 2 (two) times daily. 03/21/18   Elson Areas, PA-C  fluticasone (FLONASE) 50 MCG/ACT nasal spray Place 2 sprays into both nostrils daily as needed for allergies.  10/27/15   [provider]  ketotifen (ZADITOR) 0.025 % ophthalmic solution Place 1 drop into both eyes 2 (two) times daily as needed (dry eyes).    [provider]  Levothyroxine Sodium 112 MCG CAPS Take by mouth.    [provider]  LORazepam (ATIVAN) 0.5 MG tablet Take 0.5 mg by mouth.    [provider]  meclizine (ANTIVERT) 25 MG  tablet Take 25 mg by mouth 3 (three) times daily as needed for dizziness.    [provider]  meloxicam (MOBIC) 7.5 MG tablet Take 7.5 mg by mouth daily as needed for pain.    [provider]  Meth-Hyo-M Bl-Na Phos-Ph Sal (URIBEL PO) Take 1 tablet by mouth 2 (two) times daily.    [provider]  ondansetron (ZOFRAN ODT) 4 MG disintegrating tablet Take 1 tablet (4 mg total) by mouth every 8 (eight) hours as needed for nausea or vomiting. 03/21/18   Elson Areas, PA-C  sulfamethoxazole-trimethoprim (BACTRIM,SEPTRA) 400-80 MG tablet 1 tab po qhs 02/04/16   [provider]    Family History Family History  Problem Relation Age of Onset  . Hypertension Mother   . Cancer Father        lung  . Hypertension Son   . Colon cancer Neg Hx     Social History Social History   Tobacco Use  . Smoking status: Never Smoker  . Smokeless tobacco: Never Used  Substance Use Topics  . Alcohol use: Yes    Alcohol/week: 1.0 standard drinks    Types: 1 Glasses of wine per week    Comment: social, rare wine  . Drug use: No     Allergies   Amoxicillin; Asa [aspirin]; Demerol [meperidine]; Vicodin [hydrocodone-acetaminophen]; and Tramadol   Review of Systems Review of Systems  All other systems reviewed and are negative.    Physical Exam Updated Vital Signs BP 129/68 (BP Location: Right Arm)   Pulse 74   Temp 97.9 F (36.6 C) (Oral)   Resp 20   Ht 5\' 1"  (1.549 m)   Wt 62.1 kg   SpO2 98%   BMI 25.89 kg/m   Physical Exam  Constitutional: She is oriented to person, place, and time. She appears well-developed and well-nourished. No distress.  HENT:  Head: Normocephalic and atraumatic.  There is erythema and warmth to both cheeks, forehead, and nose.  There is no intraoral or intraocular involvement on exam.  Neck: Normal range of motion. Neck supple.  Cardiovascular: Normal rate and regular rhythm. Exam reveals no gallop and no friction rub.  No  murmur heard. Pulmonary/Chest: Effort normal and breath sounds normal. No respiratory distress. She has no wheezes.  Abdominal: Soft. Bowel sounds are normal. She exhibits no distension. There is no tenderness.  Musculoskeletal: Normal range of motion.  Neurological: She is alert and oriented to person, place, and time.  Skin: Skin is warm and dry. She is not diaphoretic.  Nursing note and vitals reviewed.    ED Treatments / Results  Labs (all labs ordered are listed, but only abnormal results are displayed) Labs Reviewed - No data to display  EKG None  Radiology No results found.  Procedures Procedures (including critical care time)  Medications Ordered in ED Medications  cefTRIAXone (ROCEPHIN) injection 1 g (has no administration  in time range)     Initial Impression / Assessment and Plan / ED Course  I have reviewed the triage vital signs and the nursing notes.  Pertinent labs & imaging results that were available during my care of the patient were reviewed by me and considered in my medical decision making (see chart for details).  This appears to be a facial cellulitis.  She will be given intramuscular Rocephin which will hopefully bridge the gap until the doxycycline given orally reaches therapeutic levels.  There is no evidence for any airway involvement and she is not septic appearing.  Final Clinical Impressions(s) / ED Diagnoses   Final diagnoses:  None    ED Discharge Orders    None       Geoffery Lyonselo, Niccolo Burggraf, MD 03/22/18 [phone]

## 2018-03-22 NOTE — Discharge Instructions (Addendum)
Continue doxycycline as previously prescribed.  Return to the emergency department if you develop high fever, difficulty breathing, or other new and concerning symptoms.

## 2018-03-22 NOTE — ED Triage Notes (Signed)
Pt seen in the e.d. Yesterday, diagnosed with cellulitis and started on antibiotics for same, states the redness in her face is worse tonight, with burning and itching.

## 2019-10-02 DIAGNOSIS — F0391 Unspecified dementia with behavioral disturbance: Secondary | ICD-10-CM | POA: Diagnosis not present

## 2019-10-02 DIAGNOSIS — F0151 Vascular dementia with behavioral disturbance: Secondary | ICD-10-CM | POA: Diagnosis not present

## 2019-10-02 DIAGNOSIS — R451 Restlessness and agitation: Secondary | ICD-10-CM | POA: Diagnosis not present

## 2019-10-02 DIAGNOSIS — F418 Other specified anxiety disorders: Secondary | ICD-10-CM | POA: Diagnosis not present

## 2019-10-02 DIAGNOSIS — G4700 Insomnia, unspecified: Secondary | ICD-10-CM | POA: Diagnosis not present

## 2019-10-23 DIAGNOSIS — E039 Hypothyroidism, unspecified: Secondary | ICD-10-CM | POA: Diagnosis not present

## 2019-10-24 DIAGNOSIS — E039 Hypothyroidism, unspecified: Secondary | ICD-10-CM | POA: Diagnosis not present

## 2019-10-24 DIAGNOSIS — G309 Alzheimer's disease, unspecified: Secondary | ICD-10-CM | POA: Diagnosis not present

## 2019-12-01 DIAGNOSIS — Z90710 Acquired absence of both cervix and uterus: Secondary | ICD-10-CM | POA: Diagnosis not present

## 2019-12-01 DIAGNOSIS — Z9049 Acquired absence of other specified parts of digestive tract: Secondary | ICD-10-CM | POA: Diagnosis not present

## 2019-12-01 DIAGNOSIS — K589 Irritable bowel syndrome without diarrhea: Secondary | ICD-10-CM | POA: Diagnosis not present

## 2019-12-01 DIAGNOSIS — I10 Essential (primary) hypertension: Secondary | ICD-10-CM | POA: Diagnosis not present

## 2019-12-01 DIAGNOSIS — K219 Gastro-esophageal reflux disease without esophagitis: Secondary | ICD-10-CM | POA: Diagnosis not present

## 2019-12-01 DIAGNOSIS — E039 Hypothyroidism, unspecified: Secondary | ICD-10-CM | POA: Diagnosis not present

## 2019-12-01 DIAGNOSIS — R410 Disorientation, unspecified: Secondary | ICD-10-CM | POA: Diagnosis not present

## 2019-12-01 DIAGNOSIS — N390 Urinary tract infection, site not specified: Secondary | ICD-10-CM | POA: Diagnosis not present

## 2019-12-15 DIAGNOSIS — N3000 Acute cystitis without hematuria: Secondary | ICD-10-CM | POA: Diagnosis not present

## 2019-12-15 DIAGNOSIS — F22 Delusional disorders: Secondary | ICD-10-CM | POA: Diagnosis not present

## 2019-12-15 DIAGNOSIS — R451 Restlessness and agitation: Secondary | ICD-10-CM | POA: Diagnosis not present

## 2019-12-15 DIAGNOSIS — F0390 Unspecified dementia without behavioral disturbance: Secondary | ICD-10-CM | POA: Diagnosis not present

## 2019-12-15 DIAGNOSIS — F0151 Vascular dementia with behavioral disturbance: Secondary | ICD-10-CM | POA: Diagnosis not present

## 2019-12-15 DIAGNOSIS — F418 Other specified anxiety disorders: Secondary | ICD-10-CM | POA: Diagnosis not present

## 2019-12-15 DIAGNOSIS — G4700 Insomnia, unspecified: Secondary | ICD-10-CM | POA: Diagnosis not present

## 2020-01-22 DIAGNOSIS — G4700 Insomnia, unspecified: Secondary | ICD-10-CM | POA: Diagnosis not present

## 2020-01-22 DIAGNOSIS — F22 Delusional disorders: Secondary | ICD-10-CM | POA: Diagnosis not present

## 2020-01-22 DIAGNOSIS — R451 Restlessness and agitation: Secondary | ICD-10-CM | POA: Diagnosis not present

## 2020-01-22 DIAGNOSIS — F418 Other specified anxiety disorders: Secondary | ICD-10-CM | POA: Diagnosis not present

## 2020-01-22 DIAGNOSIS — F0151 Vascular dementia with behavioral disturbance: Secondary | ICD-10-CM | POA: Diagnosis not present

## 2020-01-22 DIAGNOSIS — F0391 Unspecified dementia with behavioral disturbance: Secondary | ICD-10-CM | POA: Diagnosis not present

## 2020-03-25 DIAGNOSIS — R269 Unspecified abnormalities of gait and mobility: Secondary | ICD-10-CM | POA: Diagnosis not present

## 2020-03-25 DIAGNOSIS — F418 Other specified anxiety disorders: Secondary | ICD-10-CM | POA: Diagnosis not present

## 2020-03-25 DIAGNOSIS — F22 Delusional disorders: Secondary | ICD-10-CM | POA: Diagnosis not present

## 2020-03-25 DIAGNOSIS — F0151 Vascular dementia with behavioral disturbance: Secondary | ICD-10-CM | POA: Diagnosis not present

## 2020-03-25 DIAGNOSIS — Z79899 Other long term (current) drug therapy: Secondary | ICD-10-CM | POA: Diagnosis not present

## 2020-03-25 DIAGNOSIS — E039 Hypothyroidism, unspecified: Secondary | ICD-10-CM | POA: Diagnosis not present

## 2020-03-25 DIAGNOSIS — R451 Restlessness and agitation: Secondary | ICD-10-CM | POA: Diagnosis not present

## 2020-03-25 DIAGNOSIS — G4700 Insomnia, unspecified: Secondary | ICD-10-CM | POA: Diagnosis not present

## 2020-04-23 DIAGNOSIS — G4700 Insomnia, unspecified: Secondary | ICD-10-CM | POA: Diagnosis not present

## 2020-04-23 DIAGNOSIS — E039 Hypothyroidism, unspecified: Secondary | ICD-10-CM | POA: Diagnosis not present

## 2020-04-23 DIAGNOSIS — Z79899 Other long term (current) drug therapy: Secondary | ICD-10-CM | POA: Diagnosis not present

## 2020-04-23 DIAGNOSIS — M1990 Unspecified osteoarthritis, unspecified site: Secondary | ICD-10-CM | POA: Diagnosis not present

## 2020-04-23 DIAGNOSIS — G309 Alzheimer's disease, unspecified: Secondary | ICD-10-CM | POA: Diagnosis not present

## 2020-04-30 DIAGNOSIS — I493 Ventricular premature depolarization: Secondary | ICD-10-CM | POA: Diagnosis not present

## 2020-04-30 DIAGNOSIS — E039 Hypothyroidism, unspecified: Secondary | ICD-10-CM | POA: Diagnosis not present

## 2020-04-30 DIAGNOSIS — F0151 Vascular dementia with behavioral disturbance: Secondary | ICD-10-CM | POA: Diagnosis not present

## 2020-04-30 DIAGNOSIS — Z23 Encounter for immunization: Secondary | ICD-10-CM | POA: Diagnosis not present

## 2020-04-30 DIAGNOSIS — E785 Hyperlipidemia, unspecified: Secondary | ICD-10-CM | POA: Diagnosis not present

## 2020-04-30 DIAGNOSIS — Z0001 Encounter for general adult medical examination with abnormal findings: Secondary | ICD-10-CM | POA: Diagnosis not present

## 2020-06-24 DIAGNOSIS — F22 Delusional disorders: Secondary | ICD-10-CM | POA: Diagnosis not present

## 2020-06-24 DIAGNOSIS — R4189 Other symptoms and signs involving cognitive functions and awareness: Secondary | ICD-10-CM | POA: Diagnosis not present

## 2020-06-24 DIAGNOSIS — F0391 Unspecified dementia with behavioral disturbance: Secondary | ICD-10-CM | POA: Diagnosis not present

## 2020-06-24 DIAGNOSIS — F418 Other specified anxiety disorders: Secondary | ICD-10-CM | POA: Diagnosis not present

## 2020-07-01 DIAGNOSIS — E890 Postprocedural hypothyroidism: Secondary | ICD-10-CM | POA: Diagnosis not present

## 2020-07-01 DIAGNOSIS — F0391 Unspecified dementia with behavioral disturbance: Secondary | ICD-10-CM | POA: Diagnosis not present

## 2020-07-01 DIAGNOSIS — F418 Other specified anxiety disorders: Secondary | ICD-10-CM | POA: Diagnosis not present

## 2020-07-01 DIAGNOSIS — Z87440 Personal history of urinary (tract) infections: Secondary | ICD-10-CM | POA: Diagnosis not present

## 2020-07-01 DIAGNOSIS — F22 Delusional disorders: Secondary | ICD-10-CM | POA: Diagnosis not present

## 2020-07-01 DIAGNOSIS — Z9181 History of falling: Secondary | ICD-10-CM | POA: Diagnosis not present

## 2020-07-01 DIAGNOSIS — R2689 Other abnormalities of gait and mobility: Secondary | ICD-10-CM | POA: Diagnosis not present

## 2020-07-01 DIAGNOSIS — K219 Gastro-esophageal reflux disease without esophagitis: Secondary | ICD-10-CM | POA: Diagnosis not present

## 2020-07-01 DIAGNOSIS — H8110 Benign paroxysmal vertigo, unspecified ear: Secondary | ICD-10-CM | POA: Diagnosis not present

## 2020-07-13 DIAGNOSIS — Z9181 History of falling: Secondary | ICD-10-CM | POA: Diagnosis not present

## 2020-07-13 DIAGNOSIS — Z87440 Personal history of urinary (tract) infections: Secondary | ICD-10-CM | POA: Diagnosis not present

## 2020-07-13 DIAGNOSIS — F418 Other specified anxiety disorders: Secondary | ICD-10-CM | POA: Diagnosis not present

## 2020-07-13 DIAGNOSIS — R2689 Other abnormalities of gait and mobility: Secondary | ICD-10-CM | POA: Diagnosis not present

## 2020-07-13 DIAGNOSIS — F22 Delusional disorders: Secondary | ICD-10-CM | POA: Diagnosis not present

## 2020-07-13 DIAGNOSIS — F0391 Unspecified dementia with behavioral disturbance: Secondary | ICD-10-CM | POA: Diagnosis not present

## 2020-07-13 DIAGNOSIS — K219 Gastro-esophageal reflux disease without esophagitis: Secondary | ICD-10-CM | POA: Diagnosis not present

## 2020-07-13 DIAGNOSIS — E890 Postprocedural hypothyroidism: Secondary | ICD-10-CM | POA: Diagnosis not present

## 2020-07-13 DIAGNOSIS — H8110 Benign paroxysmal vertigo, unspecified ear: Secondary | ICD-10-CM | POA: Diagnosis not present

## 2020-07-21 DIAGNOSIS — M1711 Unilateral primary osteoarthritis, right knee: Secondary | ICD-10-CM | POA: Diagnosis not present

## 2020-07-21 DIAGNOSIS — M25561 Pain in right knee: Secondary | ICD-10-CM | POA: Diagnosis not present

## 2020-07-21 DIAGNOSIS — M170 Bilateral primary osteoarthritis of knee: Secondary | ICD-10-CM | POA: Diagnosis not present

## 2020-07-21 DIAGNOSIS — M1712 Unilateral primary osteoarthritis, left knee: Secondary | ICD-10-CM | POA: Diagnosis not present

## 2020-07-21 DIAGNOSIS — R269 Unspecified abnormalities of gait and mobility: Secondary | ICD-10-CM | POA: Diagnosis not present

## 2020-07-21 DIAGNOSIS — G8929 Other chronic pain: Secondary | ICD-10-CM | POA: Diagnosis not present

## 2020-07-21 DIAGNOSIS — Z885 Allergy status to narcotic agent status: Secondary | ICD-10-CM | POA: Diagnosis not present

## 2020-07-21 DIAGNOSIS — N920 Excessive and frequent menstruation with regular cycle: Secondary | ICD-10-CM | POA: Diagnosis not present

## 2020-07-21 DIAGNOSIS — Z886 Allergy status to analgesic agent status: Secondary | ICD-10-CM | POA: Diagnosis not present

## 2020-07-21 DIAGNOSIS — M25562 Pain in left knee: Secondary | ICD-10-CM | POA: Diagnosis not present

## 2020-07-21 DIAGNOSIS — Z880 Allergy status to penicillin: Secondary | ICD-10-CM | POA: Diagnosis not present

## 2020-07-23 DIAGNOSIS — K219 Gastro-esophageal reflux disease without esophagitis: Secondary | ICD-10-CM | POA: Diagnosis not present

## 2020-07-23 DIAGNOSIS — F0391 Unspecified dementia with behavioral disturbance: Secondary | ICD-10-CM | POA: Diagnosis not present

## 2020-07-23 DIAGNOSIS — R2689 Other abnormalities of gait and mobility: Secondary | ICD-10-CM | POA: Diagnosis not present

## 2020-07-23 DIAGNOSIS — F418 Other specified anxiety disorders: Secondary | ICD-10-CM | POA: Diagnosis not present

## 2020-07-23 DIAGNOSIS — F22 Delusional disorders: Secondary | ICD-10-CM | POA: Diagnosis not present

## 2020-07-23 DIAGNOSIS — Z87440 Personal history of urinary (tract) infections: Secondary | ICD-10-CM | POA: Diagnosis not present

## 2020-07-23 DIAGNOSIS — E890 Postprocedural hypothyroidism: Secondary | ICD-10-CM | POA: Diagnosis not present

## 2020-07-23 DIAGNOSIS — H8110 Benign paroxysmal vertigo, unspecified ear: Secondary | ICD-10-CM | POA: Diagnosis not present

## 2020-07-23 DIAGNOSIS — Z9181 History of falling: Secondary | ICD-10-CM | POA: Diagnosis not present

## 2020-07-26 DIAGNOSIS — K219 Gastro-esophageal reflux disease without esophagitis: Secondary | ICD-10-CM | POA: Diagnosis not present

## 2020-07-26 DIAGNOSIS — Z87440 Personal history of urinary (tract) infections: Secondary | ICD-10-CM | POA: Diagnosis not present

## 2020-07-26 DIAGNOSIS — R2689 Other abnormalities of gait and mobility: Secondary | ICD-10-CM | POA: Diagnosis not present

## 2020-07-26 DIAGNOSIS — F0391 Unspecified dementia with behavioral disturbance: Secondary | ICD-10-CM | POA: Diagnosis not present

## 2020-07-26 DIAGNOSIS — H8110 Benign paroxysmal vertigo, unspecified ear: Secondary | ICD-10-CM | POA: Diagnosis not present

## 2020-07-26 DIAGNOSIS — F22 Delusional disorders: Secondary | ICD-10-CM | POA: Diagnosis not present

## 2020-07-26 DIAGNOSIS — F418 Other specified anxiety disorders: Secondary | ICD-10-CM | POA: Diagnosis not present

## 2020-07-26 DIAGNOSIS — Z9181 History of falling: Secondary | ICD-10-CM | POA: Diagnosis not present

## 2020-07-26 DIAGNOSIS — E890 Postprocedural hypothyroidism: Secondary | ICD-10-CM | POA: Diagnosis not present

## 2020-09-22 DIAGNOSIS — R269 Unspecified abnormalities of gait and mobility: Secondary | ICD-10-CM | POA: Diagnosis not present

## 2020-09-22 DIAGNOSIS — W19XXXA Unspecified fall, initial encounter: Secondary | ICD-10-CM | POA: Diagnosis not present

## 2020-09-22 DIAGNOSIS — R5381 Other malaise: Secondary | ICD-10-CM | POA: Diagnosis not present

## 2020-09-22 DIAGNOSIS — R634 Abnormal weight loss: Secondary | ICD-10-CM | POA: Diagnosis not present

## 2020-09-22 DIAGNOSIS — F0391 Unspecified dementia with behavioral disturbance: Secondary | ICD-10-CM | POA: Diagnosis not present

## 2020-11-11 DIAGNOSIS — F0280 Dementia in other diseases classified elsewhere without behavioral disturbance: Secondary | ICD-10-CM | POA: Diagnosis not present

## 2020-11-11 DIAGNOSIS — R5381 Other malaise: Secondary | ICD-10-CM | POA: Diagnosis not present

## 2020-11-11 DIAGNOSIS — E039 Hypothyroidism, unspecified: Secondary | ICD-10-CM | POA: Diagnosis not present

## 2020-11-11 DIAGNOSIS — G309 Alzheimer's disease, unspecified: Secondary | ICD-10-CM | POA: Diagnosis not present

## 2020-11-11 DIAGNOSIS — R293 Abnormal posture: Secondary | ICD-10-CM | POA: Diagnosis not present

## 2020-11-11 DIAGNOSIS — M6281 Muscle weakness (generalized): Secondary | ICD-10-CM | POA: Diagnosis not present

## 2020-11-11 DIAGNOSIS — Z79899 Other long term (current) drug therapy: Secondary | ICD-10-CM | POA: Diagnosis not present

## 2020-11-11 DIAGNOSIS — R531 Weakness: Secondary | ICD-10-CM | POA: Diagnosis not present

## 2020-11-11 DIAGNOSIS — E785 Hyperlipidemia, unspecified: Secondary | ICD-10-CM | POA: Diagnosis not present

## 2020-11-11 DIAGNOSIS — R269 Unspecified abnormalities of gait and mobility: Secondary | ICD-10-CM | POA: Diagnosis not present

## 2020-11-11 DIAGNOSIS — F32A Depression, unspecified: Secondary | ICD-10-CM | POA: Diagnosis not present

## 2020-11-12 DIAGNOSIS — M6281 Muscle weakness (generalized): Secondary | ICD-10-CM | POA: Diagnosis not present

## 2020-11-12 DIAGNOSIS — R293 Abnormal posture: Secondary | ICD-10-CM | POA: Diagnosis not present

## 2020-11-12 DIAGNOSIS — F0280 Dementia in other diseases classified elsewhere without behavioral disturbance: Secondary | ICD-10-CM | POA: Diagnosis not present

## 2020-11-12 DIAGNOSIS — R269 Unspecified abnormalities of gait and mobility: Secondary | ICD-10-CM | POA: Diagnosis not present

## 2020-11-12 DIAGNOSIS — G309 Alzheimer's disease, unspecified: Secondary | ICD-10-CM | POA: Diagnosis not present

## 2020-11-13 DIAGNOSIS — F0280 Dementia in other diseases classified elsewhere without behavioral disturbance: Secondary | ICD-10-CM | POA: Diagnosis not present

## 2020-11-13 DIAGNOSIS — R269 Unspecified abnormalities of gait and mobility: Secondary | ICD-10-CM | POA: Diagnosis not present

## 2020-11-13 DIAGNOSIS — R293 Abnormal posture: Secondary | ICD-10-CM | POA: Diagnosis not present

## 2020-11-13 DIAGNOSIS — M6281 Muscle weakness (generalized): Secondary | ICD-10-CM | POA: Diagnosis not present

## 2020-11-13 DIAGNOSIS — G309 Alzheimer's disease, unspecified: Secondary | ICD-10-CM | POA: Diagnosis not present

## 2020-11-16 DIAGNOSIS — F0280 Dementia in other diseases classified elsewhere without behavioral disturbance: Secondary | ICD-10-CM | POA: Diagnosis not present

## 2020-11-16 DIAGNOSIS — R293 Abnormal posture: Secondary | ICD-10-CM | POA: Diagnosis not present

## 2020-11-16 DIAGNOSIS — M6281 Muscle weakness (generalized): Secondary | ICD-10-CM | POA: Diagnosis not present

## 2020-11-16 DIAGNOSIS — R269 Unspecified abnormalities of gait and mobility: Secondary | ICD-10-CM | POA: Diagnosis not present

## 2020-11-16 DIAGNOSIS — G309 Alzheimer's disease, unspecified: Secondary | ICD-10-CM | POA: Diagnosis not present

## 2020-11-17 DIAGNOSIS — F0280 Dementia in other diseases classified elsewhere without behavioral disturbance: Secondary | ICD-10-CM | POA: Diagnosis not present

## 2020-11-17 DIAGNOSIS — R293 Abnormal posture: Secondary | ICD-10-CM | POA: Diagnosis not present

## 2020-11-17 DIAGNOSIS — G309 Alzheimer's disease, unspecified: Secondary | ICD-10-CM | POA: Diagnosis not present

## 2020-11-17 DIAGNOSIS — M6281 Muscle weakness (generalized): Secondary | ICD-10-CM | POA: Diagnosis not present

## 2020-11-17 DIAGNOSIS — R269 Unspecified abnormalities of gait and mobility: Secondary | ICD-10-CM | POA: Diagnosis not present

## 2020-11-18 DIAGNOSIS — F0280 Dementia in other diseases classified elsewhere without behavioral disturbance: Secondary | ICD-10-CM | POA: Diagnosis not present

## 2020-11-18 DIAGNOSIS — M6281 Muscle weakness (generalized): Secondary | ICD-10-CM | POA: Diagnosis not present

## 2020-11-18 DIAGNOSIS — R799 Abnormal finding of blood chemistry, unspecified: Secondary | ICD-10-CM | POA: Diagnosis not present

## 2020-11-18 DIAGNOSIS — R293 Abnormal posture: Secondary | ICD-10-CM | POA: Diagnosis not present

## 2020-11-18 DIAGNOSIS — R269 Unspecified abnormalities of gait and mobility: Secondary | ICD-10-CM | POA: Diagnosis not present

## 2020-11-18 DIAGNOSIS — G309 Alzheimer's disease, unspecified: Secondary | ICD-10-CM | POA: Diagnosis not present

## 2020-11-19 DIAGNOSIS — R269 Unspecified abnormalities of gait and mobility: Secondary | ICD-10-CM | POA: Diagnosis not present

## 2020-11-19 DIAGNOSIS — M6281 Muscle weakness (generalized): Secondary | ICD-10-CM | POA: Diagnosis not present

## 2020-11-19 DIAGNOSIS — F0280 Dementia in other diseases classified elsewhere without behavioral disturbance: Secondary | ICD-10-CM | POA: Diagnosis not present

## 2020-11-19 DIAGNOSIS — R293 Abnormal posture: Secondary | ICD-10-CM | POA: Diagnosis not present

## 2020-11-19 DIAGNOSIS — G309 Alzheimer's disease, unspecified: Secondary | ICD-10-CM | POA: Diagnosis not present

## 2020-11-22 DIAGNOSIS — F338 Other recurrent depressive disorders: Secondary | ICD-10-CM | POA: Diagnosis not present

## 2020-11-22 DIAGNOSIS — F411 Generalized anxiety disorder: Secondary | ICD-10-CM | POA: Diagnosis not present

## 2020-11-22 DIAGNOSIS — G309 Alzheimer's disease, unspecified: Secondary | ICD-10-CM | POA: Diagnosis not present

## 2020-11-22 DIAGNOSIS — F0280 Dementia in other diseases classified elsewhere without behavioral disturbance: Secondary | ICD-10-CM | POA: Diagnosis not present

## 2020-11-22 DIAGNOSIS — G301 Alzheimer's disease with late onset: Secondary | ICD-10-CM | POA: Diagnosis not present

## 2020-11-22 DIAGNOSIS — F0390 Unspecified dementia without behavioral disturbance: Secondary | ICD-10-CM | POA: Diagnosis not present

## 2020-11-22 DIAGNOSIS — R293 Abnormal posture: Secondary | ICD-10-CM | POA: Diagnosis not present

## 2020-11-22 DIAGNOSIS — R269 Unspecified abnormalities of gait and mobility: Secondary | ICD-10-CM | POA: Diagnosis not present

## 2020-11-22 DIAGNOSIS — M6281 Muscle weakness (generalized): Secondary | ICD-10-CM | POA: Diagnosis not present

## 2020-11-23 DIAGNOSIS — G309 Alzheimer's disease, unspecified: Secondary | ICD-10-CM | POA: Diagnosis not present

## 2020-11-23 DIAGNOSIS — R293 Abnormal posture: Secondary | ICD-10-CM | POA: Diagnosis not present

## 2020-11-23 DIAGNOSIS — R269 Unspecified abnormalities of gait and mobility: Secondary | ICD-10-CM | POA: Diagnosis not present

## 2020-11-23 DIAGNOSIS — M6281 Muscle weakness (generalized): Secondary | ICD-10-CM | POA: Diagnosis not present

## 2020-11-23 DIAGNOSIS — F0280 Dementia in other diseases classified elsewhere without behavioral disturbance: Secondary | ICD-10-CM | POA: Diagnosis not present

## 2020-11-24 DIAGNOSIS — M6281 Muscle weakness (generalized): Secondary | ICD-10-CM | POA: Diagnosis not present

## 2020-11-24 DIAGNOSIS — R269 Unspecified abnormalities of gait and mobility: Secondary | ICD-10-CM | POA: Diagnosis not present

## 2020-11-24 DIAGNOSIS — R293 Abnormal posture: Secondary | ICD-10-CM | POA: Diagnosis not present

## 2020-11-24 DIAGNOSIS — G309 Alzheimer's disease, unspecified: Secondary | ICD-10-CM | POA: Diagnosis not present

## 2020-11-24 DIAGNOSIS — F0280 Dementia in other diseases classified elsewhere without behavioral disturbance: Secondary | ICD-10-CM | POA: Diagnosis not present

## 2020-11-25 DIAGNOSIS — R293 Abnormal posture: Secondary | ICD-10-CM | POA: Diagnosis not present

## 2020-11-25 DIAGNOSIS — M6281 Muscle weakness (generalized): Secondary | ICD-10-CM | POA: Diagnosis not present

## 2020-11-25 DIAGNOSIS — F0280 Dementia in other diseases classified elsewhere without behavioral disturbance: Secondary | ICD-10-CM | POA: Diagnosis not present

## 2020-11-25 DIAGNOSIS — G309 Alzheimer's disease, unspecified: Secondary | ICD-10-CM | POA: Diagnosis not present

## 2020-11-25 DIAGNOSIS — R269 Unspecified abnormalities of gait and mobility: Secondary | ICD-10-CM | POA: Diagnosis not present

## 2020-11-26 DIAGNOSIS — M6281 Muscle weakness (generalized): Secondary | ICD-10-CM | POA: Diagnosis not present

## 2020-11-26 DIAGNOSIS — F0280 Dementia in other diseases classified elsewhere without behavioral disturbance: Secondary | ICD-10-CM | POA: Diagnosis not present

## 2020-11-26 DIAGNOSIS — G309 Alzheimer's disease, unspecified: Secondary | ICD-10-CM | POA: Diagnosis not present

## 2020-11-26 DIAGNOSIS — R293 Abnormal posture: Secondary | ICD-10-CM | POA: Diagnosis not present

## 2020-11-26 DIAGNOSIS — R269 Unspecified abnormalities of gait and mobility: Secondary | ICD-10-CM | POA: Diagnosis not present

## 2020-11-29 DIAGNOSIS — G309 Alzheimer's disease, unspecified: Secondary | ICD-10-CM | POA: Diagnosis not present

## 2020-11-29 DIAGNOSIS — R293 Abnormal posture: Secondary | ICD-10-CM | POA: Diagnosis not present

## 2020-11-29 DIAGNOSIS — M6281 Muscle weakness (generalized): Secondary | ICD-10-CM | POA: Diagnosis not present

## 2020-11-29 DIAGNOSIS — R269 Unspecified abnormalities of gait and mobility: Secondary | ICD-10-CM | POA: Diagnosis not present

## 2020-11-29 DIAGNOSIS — F0280 Dementia in other diseases classified elsewhere without behavioral disturbance: Secondary | ICD-10-CM | POA: Diagnosis not present

## 2020-11-30 DIAGNOSIS — G309 Alzheimer's disease, unspecified: Secondary | ICD-10-CM | POA: Diagnosis not present

## 2020-11-30 DIAGNOSIS — R293 Abnormal posture: Secondary | ICD-10-CM | POA: Diagnosis not present

## 2020-11-30 DIAGNOSIS — F0280 Dementia in other diseases classified elsewhere without behavioral disturbance: Secondary | ICD-10-CM | POA: Diagnosis not present

## 2020-11-30 DIAGNOSIS — R269 Unspecified abnormalities of gait and mobility: Secondary | ICD-10-CM | POA: Diagnosis not present

## 2020-11-30 DIAGNOSIS — M6281 Muscle weakness (generalized): Secondary | ICD-10-CM | POA: Diagnosis not present

## 2020-12-01 DIAGNOSIS — G309 Alzheimer's disease, unspecified: Secondary | ICD-10-CM | POA: Diagnosis not present

## 2020-12-01 DIAGNOSIS — M6281 Muscle weakness (generalized): Secondary | ICD-10-CM | POA: Diagnosis not present

## 2020-12-01 DIAGNOSIS — R293 Abnormal posture: Secondary | ICD-10-CM | POA: Diagnosis not present

## 2020-12-01 DIAGNOSIS — F0280 Dementia in other diseases classified elsewhere without behavioral disturbance: Secondary | ICD-10-CM | POA: Diagnosis not present

## 2020-12-01 DIAGNOSIS — R269 Unspecified abnormalities of gait and mobility: Secondary | ICD-10-CM | POA: Diagnosis not present

## 2020-12-02 DIAGNOSIS — R293 Abnormal posture: Secondary | ICD-10-CM | POA: Diagnosis not present

## 2020-12-02 DIAGNOSIS — G309 Alzheimer's disease, unspecified: Secondary | ICD-10-CM | POA: Diagnosis not present

## 2020-12-02 DIAGNOSIS — M6281 Muscle weakness (generalized): Secondary | ICD-10-CM | POA: Diagnosis not present

## 2020-12-02 DIAGNOSIS — F0280 Dementia in other diseases classified elsewhere without behavioral disturbance: Secondary | ICD-10-CM | POA: Diagnosis not present

## 2020-12-02 DIAGNOSIS — R269 Unspecified abnormalities of gait and mobility: Secondary | ICD-10-CM | POA: Diagnosis not present

## 2020-12-03 DIAGNOSIS — R293 Abnormal posture: Secondary | ICD-10-CM | POA: Diagnosis not present

## 2020-12-03 DIAGNOSIS — G309 Alzheimer's disease, unspecified: Secondary | ICD-10-CM | POA: Diagnosis not present

## 2020-12-03 DIAGNOSIS — M6281 Muscle weakness (generalized): Secondary | ICD-10-CM | POA: Diagnosis not present

## 2020-12-03 DIAGNOSIS — F0280 Dementia in other diseases classified elsewhere without behavioral disturbance: Secondary | ICD-10-CM | POA: Diagnosis not present

## 2020-12-03 DIAGNOSIS — R269 Unspecified abnormalities of gait and mobility: Secondary | ICD-10-CM | POA: Diagnosis not present

## 2020-12-06 DIAGNOSIS — G309 Alzheimer's disease, unspecified: Secondary | ICD-10-CM | POA: Diagnosis not present

## 2020-12-06 DIAGNOSIS — R293 Abnormal posture: Secondary | ICD-10-CM | POA: Diagnosis not present

## 2020-12-06 DIAGNOSIS — F0280 Dementia in other diseases classified elsewhere without behavioral disturbance: Secondary | ICD-10-CM | POA: Diagnosis not present

## 2020-12-06 DIAGNOSIS — R269 Unspecified abnormalities of gait and mobility: Secondary | ICD-10-CM | POA: Diagnosis not present

## 2020-12-06 DIAGNOSIS — M6281 Muscle weakness (generalized): Secondary | ICD-10-CM | POA: Diagnosis not present

## 2020-12-07 DIAGNOSIS — R293 Abnormal posture: Secondary | ICD-10-CM | POA: Diagnosis not present

## 2020-12-07 DIAGNOSIS — R269 Unspecified abnormalities of gait and mobility: Secondary | ICD-10-CM | POA: Diagnosis not present

## 2020-12-07 DIAGNOSIS — G309 Alzheimer's disease, unspecified: Secondary | ICD-10-CM | POA: Diagnosis not present

## 2020-12-07 DIAGNOSIS — F411 Generalized anxiety disorder: Secondary | ICD-10-CM | POA: Diagnosis not present

## 2020-12-07 DIAGNOSIS — F0280 Dementia in other diseases classified elsewhere without behavioral disturbance: Secondary | ICD-10-CM | POA: Diagnosis not present

## 2020-12-07 DIAGNOSIS — F0390 Unspecified dementia without behavioral disturbance: Secondary | ICD-10-CM | POA: Diagnosis not present

## 2020-12-07 DIAGNOSIS — F338 Other recurrent depressive disorders: Secondary | ICD-10-CM | POA: Diagnosis not present

## 2020-12-07 DIAGNOSIS — G301 Alzheimer's disease with late onset: Secondary | ICD-10-CM | POA: Diagnosis not present

## 2020-12-07 DIAGNOSIS — M6281 Muscle weakness (generalized): Secondary | ICD-10-CM | POA: Diagnosis not present

## 2020-12-08 DIAGNOSIS — F0280 Dementia in other diseases classified elsewhere without behavioral disturbance: Secondary | ICD-10-CM | POA: Diagnosis not present

## 2020-12-08 DIAGNOSIS — G309 Alzheimer's disease, unspecified: Secondary | ICD-10-CM | POA: Diagnosis not present

## 2020-12-08 DIAGNOSIS — R269 Unspecified abnormalities of gait and mobility: Secondary | ICD-10-CM | POA: Diagnosis not present

## 2020-12-08 DIAGNOSIS — M6281 Muscle weakness (generalized): Secondary | ICD-10-CM | POA: Diagnosis not present

## 2020-12-08 DIAGNOSIS — R293 Abnormal posture: Secondary | ICD-10-CM | POA: Diagnosis not present

## 2020-12-09 DIAGNOSIS — M6281 Muscle weakness (generalized): Secondary | ICD-10-CM | POA: Diagnosis not present

## 2020-12-09 DIAGNOSIS — R269 Unspecified abnormalities of gait and mobility: Secondary | ICD-10-CM | POA: Diagnosis not present

## 2020-12-09 DIAGNOSIS — R293 Abnormal posture: Secondary | ICD-10-CM | POA: Diagnosis not present

## 2020-12-09 DIAGNOSIS — G309 Alzheimer's disease, unspecified: Secondary | ICD-10-CM | POA: Diagnosis not present

## 2020-12-09 DIAGNOSIS — F0280 Dementia in other diseases classified elsewhere without behavioral disturbance: Secondary | ICD-10-CM | POA: Diagnosis not present

## 2020-12-10 DIAGNOSIS — R293 Abnormal posture: Secondary | ICD-10-CM | POA: Diagnosis not present

## 2020-12-10 DIAGNOSIS — G309 Alzheimer's disease, unspecified: Secondary | ICD-10-CM | POA: Diagnosis not present

## 2020-12-10 DIAGNOSIS — F0280 Dementia in other diseases classified elsewhere without behavioral disturbance: Secondary | ICD-10-CM | POA: Diagnosis not present

## 2020-12-10 DIAGNOSIS — R269 Unspecified abnormalities of gait and mobility: Secondary | ICD-10-CM | POA: Diagnosis not present

## 2020-12-10 DIAGNOSIS — M6281 Muscle weakness (generalized): Secondary | ICD-10-CM | POA: Diagnosis not present

## 2020-12-13 DIAGNOSIS — R293 Abnormal posture: Secondary | ICD-10-CM | POA: Diagnosis not present

## 2020-12-13 DIAGNOSIS — F411 Generalized anxiety disorder: Secondary | ICD-10-CM | POA: Diagnosis not present

## 2020-12-13 DIAGNOSIS — G309 Alzheimer's disease, unspecified: Secondary | ICD-10-CM | POA: Diagnosis not present

## 2020-12-13 DIAGNOSIS — R269 Unspecified abnormalities of gait and mobility: Secondary | ICD-10-CM | POA: Diagnosis not present

## 2020-12-13 DIAGNOSIS — M6281 Muscle weakness (generalized): Secondary | ICD-10-CM | POA: Diagnosis not present

## 2020-12-13 DIAGNOSIS — F338 Other recurrent depressive disorders: Secondary | ICD-10-CM | POA: Diagnosis not present

## 2020-12-13 DIAGNOSIS — F0280 Dementia in other diseases classified elsewhere without behavioral disturbance: Secondary | ICD-10-CM | POA: Diagnosis not present

## 2020-12-13 DIAGNOSIS — G301 Alzheimer's disease with late onset: Secondary | ICD-10-CM | POA: Diagnosis not present

## 2020-12-13 DIAGNOSIS — F0390 Unspecified dementia without behavioral disturbance: Secondary | ICD-10-CM | POA: Diagnosis not present

## 2020-12-14 DIAGNOSIS — M6281 Muscle weakness (generalized): Secondary | ICD-10-CM | POA: Diagnosis not present

## 2020-12-14 DIAGNOSIS — R269 Unspecified abnormalities of gait and mobility: Secondary | ICD-10-CM | POA: Diagnosis not present

## 2020-12-14 DIAGNOSIS — F0280 Dementia in other diseases classified elsewhere without behavioral disturbance: Secondary | ICD-10-CM | POA: Diagnosis not present

## 2020-12-14 DIAGNOSIS — R293 Abnormal posture: Secondary | ICD-10-CM | POA: Diagnosis not present

## 2020-12-14 DIAGNOSIS — G309 Alzheimer's disease, unspecified: Secondary | ICD-10-CM | POA: Diagnosis not present

## 2020-12-16 DIAGNOSIS — G309 Alzheimer's disease, unspecified: Secondary | ICD-10-CM | POA: Diagnosis not present

## 2020-12-16 DIAGNOSIS — M6281 Muscle weakness (generalized): Secondary | ICD-10-CM | POA: Diagnosis not present

## 2020-12-16 DIAGNOSIS — R269 Unspecified abnormalities of gait and mobility: Secondary | ICD-10-CM | POA: Diagnosis not present

## 2020-12-16 DIAGNOSIS — R293 Abnormal posture: Secondary | ICD-10-CM | POA: Diagnosis not present

## 2020-12-16 DIAGNOSIS — F0280 Dementia in other diseases classified elsewhere without behavioral disturbance: Secondary | ICD-10-CM | POA: Diagnosis not present

## 2020-12-21 DIAGNOSIS — F0280 Dementia in other diseases classified elsewhere without behavioral disturbance: Secondary | ICD-10-CM | POA: Diagnosis not present

## 2020-12-21 DIAGNOSIS — R269 Unspecified abnormalities of gait and mobility: Secondary | ICD-10-CM | POA: Diagnosis not present

## 2020-12-21 DIAGNOSIS — R293 Abnormal posture: Secondary | ICD-10-CM | POA: Diagnosis not present

## 2020-12-21 DIAGNOSIS — M6281 Muscle weakness (generalized): Secondary | ICD-10-CM | POA: Diagnosis not present

## 2020-12-21 DIAGNOSIS — G309 Alzheimer's disease, unspecified: Secondary | ICD-10-CM | POA: Diagnosis not present

## 2020-12-23 DIAGNOSIS — R269 Unspecified abnormalities of gait and mobility: Secondary | ICD-10-CM | POA: Diagnosis not present

## 2020-12-23 DIAGNOSIS — F0280 Dementia in other diseases classified elsewhere without behavioral disturbance: Secondary | ICD-10-CM | POA: Diagnosis not present

## 2020-12-23 DIAGNOSIS — M6281 Muscle weakness (generalized): Secondary | ICD-10-CM | POA: Diagnosis not present

## 2020-12-23 DIAGNOSIS — G309 Alzheimer's disease, unspecified: Secondary | ICD-10-CM | POA: Diagnosis not present

## 2020-12-23 DIAGNOSIS — R293 Abnormal posture: Secondary | ICD-10-CM | POA: Diagnosis not present

## 2021-01-24 DIAGNOSIS — F411 Generalized anxiety disorder: Secondary | ICD-10-CM | POA: Diagnosis not present

## 2021-01-24 DIAGNOSIS — F0390 Unspecified dementia without behavioral disturbance: Secondary | ICD-10-CM | POA: Diagnosis not present

## 2021-01-24 DIAGNOSIS — F338 Other recurrent depressive disorders: Secondary | ICD-10-CM | POA: Diagnosis not present

## 2021-01-24 DIAGNOSIS — G301 Alzheimer's disease with late onset: Secondary | ICD-10-CM | POA: Diagnosis not present

## 2021-01-26 DIAGNOSIS — F0391 Unspecified dementia with behavioral disturbance: Secondary | ICD-10-CM | POA: Diagnosis not present

## 2021-01-26 DIAGNOSIS — F329 Major depressive disorder, single episode, unspecified: Secondary | ICD-10-CM | POA: Diagnosis not present

## 2021-01-26 DIAGNOSIS — E039 Hypothyroidism, unspecified: Secondary | ICD-10-CM | POA: Diagnosis not present

## 2021-02-14 DIAGNOSIS — G301 Alzheimer's disease with late onset: Secondary | ICD-10-CM | POA: Diagnosis not present

## 2021-02-14 DIAGNOSIS — F411 Generalized anxiety disorder: Secondary | ICD-10-CM | POA: Diagnosis not present

## 2021-02-14 DIAGNOSIS — F338 Other recurrent depressive disorders: Secondary | ICD-10-CM | POA: Diagnosis not present

## 2021-02-14 DIAGNOSIS — F0390 Unspecified dementia without behavioral disturbance: Secondary | ICD-10-CM | POA: Diagnosis not present

## 2021-02-14 DIAGNOSIS — F39 Unspecified mood [affective] disorder: Secondary | ICD-10-CM | POA: Diagnosis not present

## 2021-02-18 DIAGNOSIS — L03115 Cellulitis of right lower limb: Secondary | ICD-10-CM | POA: Diagnosis not present

## 2021-02-24 DIAGNOSIS — L03115 Cellulitis of right lower limb: Secondary | ICD-10-CM | POA: Diagnosis not present

## 2021-02-28 DIAGNOSIS — F338 Other recurrent depressive disorders: Secondary | ICD-10-CM | POA: Diagnosis not present

## 2021-02-28 DIAGNOSIS — G301 Alzheimer's disease with late onset: Secondary | ICD-10-CM | POA: Diagnosis not present

## 2021-02-28 DIAGNOSIS — S60221A Contusion of right hand, initial encounter: Secondary | ICD-10-CM | POA: Diagnosis not present

## 2021-02-28 DIAGNOSIS — F39 Unspecified mood [affective] disorder: Secondary | ICD-10-CM | POA: Diagnosis not present

## 2021-02-28 DIAGNOSIS — F411 Generalized anxiety disorder: Secondary | ICD-10-CM | POA: Diagnosis not present

## 2021-02-28 DIAGNOSIS — F0390 Unspecified dementia without behavioral disturbance: Secondary | ICD-10-CM | POA: Diagnosis not present

## 2021-03-03 DIAGNOSIS — L989 Disorder of the skin and subcutaneous tissue, unspecified: Secondary | ICD-10-CM | POA: Diagnosis not present

## 2021-03-07 DIAGNOSIS — R799 Abnormal finding of blood chemistry, unspecified: Secondary | ICD-10-CM | POA: Diagnosis not present

## 2021-03-07 DIAGNOSIS — L97912 Non-pressure chronic ulcer of unspecified part of right lower leg with fat layer exposed: Secondary | ICD-10-CM | POA: Diagnosis not present

## 2021-03-07 DIAGNOSIS — G301 Alzheimer's disease with late onset: Secondary | ICD-10-CM | POA: Diagnosis not present

## 2021-03-07 DIAGNOSIS — F39 Unspecified mood [affective] disorder: Secondary | ICD-10-CM | POA: Diagnosis not present

## 2021-03-07 DIAGNOSIS — F0390 Unspecified dementia without behavioral disturbance: Secondary | ICD-10-CM | POA: Diagnosis not present

## 2021-03-07 DIAGNOSIS — Z79899 Other long term (current) drug therapy: Secondary | ICD-10-CM | POA: Diagnosis not present

## 2021-03-07 DIAGNOSIS — F411 Generalized anxiety disorder: Secondary | ICD-10-CM | POA: Diagnosis not present

## 2021-03-07 DIAGNOSIS — F338 Other recurrent depressive disorders: Secondary | ICD-10-CM | POA: Diagnosis not present

## 2021-03-08 DIAGNOSIS — R600 Localized edema: Secondary | ICD-10-CM | POA: Diagnosis not present

## 2021-03-14 DIAGNOSIS — L97913 Non-pressure chronic ulcer of unspecified part of right lower leg with necrosis of muscle: Secondary | ICD-10-CM | POA: Diagnosis not present

## 2021-03-21 DIAGNOSIS — R600 Localized edema: Secondary | ICD-10-CM | POA: Diagnosis not present

## 2021-03-24 DIAGNOSIS — E039 Hypothyroidism, unspecified: Secondary | ICD-10-CM | POA: Diagnosis not present

## 2021-03-24 DIAGNOSIS — G309 Alzheimer's disease, unspecified: Secondary | ICD-10-CM | POA: Diagnosis not present

## 2021-03-24 DIAGNOSIS — R600 Localized edema: Secondary | ICD-10-CM | POA: Diagnosis not present

## 2021-03-24 DIAGNOSIS — F0280 Dementia in other diseases classified elsewhere without behavioral disturbance: Secondary | ICD-10-CM | POA: Diagnosis not present

## 2021-03-24 DIAGNOSIS — F329 Major depressive disorder, single episode, unspecified: Secondary | ICD-10-CM | POA: Diagnosis not present

## 2021-03-28 DIAGNOSIS — L97913 Non-pressure chronic ulcer of unspecified part of right lower leg with necrosis of muscle: Secondary | ICD-10-CM | POA: Diagnosis not present

## 2021-04-04 DIAGNOSIS — L97913 Non-pressure chronic ulcer of unspecified part of right lower leg with necrosis of muscle: Secondary | ICD-10-CM | POA: Diagnosis not present

## 2021-04-05 DIAGNOSIS — F0390 Unspecified dementia without behavioral disturbance: Secondary | ICD-10-CM | POA: Diagnosis not present

## 2021-04-05 DIAGNOSIS — F338 Other recurrent depressive disorders: Secondary | ICD-10-CM | POA: Diagnosis not present

## 2021-04-05 DIAGNOSIS — F411 Generalized anxiety disorder: Secondary | ICD-10-CM | POA: Diagnosis not present

## 2021-04-05 DIAGNOSIS — F39 Unspecified mood [affective] disorder: Secondary | ICD-10-CM | POA: Diagnosis not present

## 2021-04-05 DIAGNOSIS — G301 Alzheimer's disease with late onset: Secondary | ICD-10-CM | POA: Diagnosis not present

## 2021-04-11 DIAGNOSIS — L97919 Non-pressure chronic ulcer of unspecified part of right lower leg with unspecified severity: Secondary | ICD-10-CM | POA: Diagnosis not present

## 2021-04-18 DIAGNOSIS — L97913 Non-pressure chronic ulcer of unspecified part of right lower leg with necrosis of muscle: Secondary | ICD-10-CM | POA: Diagnosis not present

## 2021-04-25 DIAGNOSIS — L97913 Non-pressure chronic ulcer of unspecified part of right lower leg with necrosis of muscle: Secondary | ICD-10-CM | POA: Diagnosis not present

## 2021-05-02 DIAGNOSIS — F411 Generalized anxiety disorder: Secondary | ICD-10-CM | POA: Diagnosis not present

## 2021-05-02 DIAGNOSIS — F338 Other recurrent depressive disorders: Secondary | ICD-10-CM | POA: Diagnosis not present

## 2021-05-02 DIAGNOSIS — L97913 Non-pressure chronic ulcer of unspecified part of right lower leg with necrosis of muscle: Secondary | ICD-10-CM | POA: Diagnosis not present

## 2021-05-02 DIAGNOSIS — G301 Alzheimer's disease with late onset: Secondary | ICD-10-CM | POA: Diagnosis not present

## 2021-05-02 DIAGNOSIS — F0390 Unspecified dementia without behavioral disturbance: Secondary | ICD-10-CM | POA: Diagnosis not present

## 2021-05-02 DIAGNOSIS — F39 Unspecified mood [affective] disorder: Secondary | ICD-10-CM | POA: Diagnosis not present

## 2021-05-09 DIAGNOSIS — L97912 Non-pressure chronic ulcer of unspecified part of right lower leg with fat layer exposed: Secondary | ICD-10-CM | POA: Diagnosis not present

## 2021-05-16 DIAGNOSIS — L97919 Non-pressure chronic ulcer of unspecified part of right lower leg with unspecified severity: Secondary | ICD-10-CM | POA: Diagnosis not present

## 2021-05-31 DIAGNOSIS — R2689 Other abnormalities of gait and mobility: Secondary | ICD-10-CM | POA: Diagnosis not present

## 2021-05-31 DIAGNOSIS — G309 Alzheimer's disease, unspecified: Secondary | ICD-10-CM | POA: Diagnosis not present

## 2021-06-04 DIAGNOSIS — G309 Alzheimer's disease, unspecified: Secondary | ICD-10-CM | POA: Diagnosis not present

## 2021-06-04 DIAGNOSIS — R2689 Other abnormalities of gait and mobility: Secondary | ICD-10-CM | POA: Diagnosis not present

## 2021-06-06 DIAGNOSIS — R2689 Other abnormalities of gait and mobility: Secondary | ICD-10-CM | POA: Diagnosis not present

## 2021-06-06 DIAGNOSIS — G309 Alzheimer's disease, unspecified: Secondary | ICD-10-CM | POA: Diagnosis not present

## 2021-06-08 DIAGNOSIS — R2689 Other abnormalities of gait and mobility: Secondary | ICD-10-CM | POA: Diagnosis not present

## 2021-06-08 DIAGNOSIS — G309 Alzheimer's disease, unspecified: Secondary | ICD-10-CM | POA: Diagnosis not present

## 2021-06-10 DIAGNOSIS — R1312 Dysphagia, oropharyngeal phase: Secondary | ICD-10-CM | POA: Diagnosis not present

## 2021-06-10 DIAGNOSIS — R2689 Other abnormalities of gait and mobility: Secondary | ICD-10-CM | POA: Diagnosis not present

## 2021-06-10 DIAGNOSIS — G309 Alzheimer's disease, unspecified: Secondary | ICD-10-CM | POA: Diagnosis not present

## 2021-06-15 DIAGNOSIS — G309 Alzheimer's disease, unspecified: Secondary | ICD-10-CM | POA: Diagnosis not present

## 2021-06-15 DIAGNOSIS — R2689 Other abnormalities of gait and mobility: Secondary | ICD-10-CM | POA: Diagnosis not present

## 2021-06-15 DIAGNOSIS — R1312 Dysphagia, oropharyngeal phase: Secondary | ICD-10-CM | POA: Diagnosis not present

## 2021-06-19 DIAGNOSIS — R1312 Dysphagia, oropharyngeal phase: Secondary | ICD-10-CM | POA: Diagnosis not present

## 2021-06-19 DIAGNOSIS — G309 Alzheimer's disease, unspecified: Secondary | ICD-10-CM | POA: Diagnosis not present

## 2021-06-19 DIAGNOSIS — R2689 Other abnormalities of gait and mobility: Secondary | ICD-10-CM | POA: Diagnosis not present

## 2021-06-20 DIAGNOSIS — R2689 Other abnormalities of gait and mobility: Secondary | ICD-10-CM | POA: Diagnosis not present

## 2021-06-20 DIAGNOSIS — G309 Alzheimer's disease, unspecified: Secondary | ICD-10-CM | POA: Diagnosis not present

## 2021-06-20 DIAGNOSIS — R1312 Dysphagia, oropharyngeal phase: Secondary | ICD-10-CM | POA: Diagnosis not present

## 2021-06-24 DIAGNOSIS — R2689 Other abnormalities of gait and mobility: Secondary | ICD-10-CM | POA: Diagnosis not present

## 2021-06-24 DIAGNOSIS — R1312 Dysphagia, oropharyngeal phase: Secondary | ICD-10-CM | POA: Diagnosis not present

## 2021-06-24 DIAGNOSIS — G309 Alzheimer's disease, unspecified: Secondary | ICD-10-CM | POA: Diagnosis not present

## 2021-06-25 DIAGNOSIS — R2689 Other abnormalities of gait and mobility: Secondary | ICD-10-CM | POA: Diagnosis not present

## 2021-06-25 DIAGNOSIS — G309 Alzheimer's disease, unspecified: Secondary | ICD-10-CM | POA: Diagnosis not present

## 2021-06-25 DIAGNOSIS — R1312 Dysphagia, oropharyngeal phase: Secondary | ICD-10-CM | POA: Diagnosis not present

## 2021-06-27 DIAGNOSIS — G309 Alzheimer's disease, unspecified: Secondary | ICD-10-CM | POA: Diagnosis not present

## 2021-06-27 DIAGNOSIS — R2689 Other abnormalities of gait and mobility: Secondary | ICD-10-CM | POA: Diagnosis not present

## 2021-06-27 DIAGNOSIS — R1312 Dysphagia, oropharyngeal phase: Secondary | ICD-10-CM | POA: Diagnosis not present

## 2021-06-29 DIAGNOSIS — R2689 Other abnormalities of gait and mobility: Secondary | ICD-10-CM | POA: Diagnosis not present

## 2021-06-29 DIAGNOSIS — G309 Alzheimer's disease, unspecified: Secondary | ICD-10-CM | POA: Diagnosis not present

## 2021-06-29 DIAGNOSIS — R1312 Dysphagia, oropharyngeal phase: Secondary | ICD-10-CM | POA: Diagnosis not present

## 2021-07-02 DIAGNOSIS — G309 Alzheimer's disease, unspecified: Secondary | ICD-10-CM | POA: Diagnosis not present

## 2021-07-02 DIAGNOSIS — R1312 Dysphagia, oropharyngeal phase: Secondary | ICD-10-CM | POA: Diagnosis not present

## 2021-07-02 DIAGNOSIS — R2689 Other abnormalities of gait and mobility: Secondary | ICD-10-CM | POA: Diagnosis not present

## 2021-07-06 DIAGNOSIS — G309 Alzheimer's disease, unspecified: Secondary | ICD-10-CM | POA: Diagnosis not present

## 2021-07-06 DIAGNOSIS — R2689 Other abnormalities of gait and mobility: Secondary | ICD-10-CM | POA: Diagnosis not present

## 2021-07-06 DIAGNOSIS — R1312 Dysphagia, oropharyngeal phase: Secondary | ICD-10-CM | POA: Diagnosis not present

## 2021-07-11 DIAGNOSIS — U071 COVID-19: Secondary | ICD-10-CM | POA: Diagnosis not present

## 2021-07-19 DIAGNOSIS — G301 Alzheimer's disease with late onset: Secondary | ICD-10-CM | POA: Diagnosis not present

## 2021-07-19 DIAGNOSIS — F0390 Unspecified dementia without behavioral disturbance: Secondary | ICD-10-CM | POA: Diagnosis not present

## 2021-07-19 DIAGNOSIS — F411 Generalized anxiety disorder: Secondary | ICD-10-CM | POA: Diagnosis not present

## 2021-07-19 DIAGNOSIS — F39 Unspecified mood [affective] disorder: Secondary | ICD-10-CM | POA: Diagnosis not present

## 2021-07-19 DIAGNOSIS — F338 Other recurrent depressive disorders: Secondary | ICD-10-CM | POA: Diagnosis not present

## 2021-07-24 DIAGNOSIS — E039 Hypothyroidism, unspecified: Secondary | ICD-10-CM | POA: Diagnosis not present

## 2021-07-24 DIAGNOSIS — F0280 Dementia in other diseases classified elsewhere without behavioral disturbance: Secondary | ICD-10-CM | POA: Diagnosis not present

## 2021-07-24 DIAGNOSIS — F329 Major depressive disorder, single episode, unspecified: Secondary | ICD-10-CM | POA: Diagnosis not present

## 2021-07-24 DIAGNOSIS — G309 Alzheimer's disease, unspecified: Secondary | ICD-10-CM | POA: Diagnosis not present

## 2021-08-09 DIAGNOSIS — F338 Other recurrent depressive disorders: Secondary | ICD-10-CM | POA: Diagnosis not present

## 2021-08-09 DIAGNOSIS — F0390 Unspecified dementia without behavioral disturbance: Secondary | ICD-10-CM | POA: Diagnosis not present

## 2021-08-09 DIAGNOSIS — G301 Alzheimer's disease with late onset: Secondary | ICD-10-CM | POA: Diagnosis not present

## 2021-08-09 DIAGNOSIS — G309 Alzheimer's disease, unspecified: Secondary | ICD-10-CM | POA: Diagnosis not present

## 2021-08-09 DIAGNOSIS — F411 Generalized anxiety disorder: Secondary | ICD-10-CM | POA: Diagnosis not present

## 2021-08-09 DIAGNOSIS — F39 Unspecified mood [affective] disorder: Secondary | ICD-10-CM | POA: Diagnosis not present

## 2021-08-09 DIAGNOSIS — R1312 Dysphagia, oropharyngeal phase: Secondary | ICD-10-CM | POA: Diagnosis not present

## 2021-08-13 DIAGNOSIS — R1312 Dysphagia, oropharyngeal phase: Secondary | ICD-10-CM | POA: Diagnosis not present

## 2021-08-13 DIAGNOSIS — G309 Alzheimer's disease, unspecified: Secondary | ICD-10-CM | POA: Diagnosis not present

## 2021-08-15 DIAGNOSIS — R1312 Dysphagia, oropharyngeal phase: Secondary | ICD-10-CM | POA: Diagnosis not present

## 2021-08-15 DIAGNOSIS — G309 Alzheimer's disease, unspecified: Secondary | ICD-10-CM | POA: Diagnosis not present

## 2021-08-16 DIAGNOSIS — F39 Unspecified mood [affective] disorder: Secondary | ICD-10-CM | POA: Diagnosis not present

## 2021-08-16 DIAGNOSIS — F338 Other recurrent depressive disorders: Secondary | ICD-10-CM | POA: Diagnosis not present

## 2021-08-16 DIAGNOSIS — F0390 Unspecified dementia without behavioral disturbance: Secondary | ICD-10-CM | POA: Diagnosis not present

## 2021-08-16 DIAGNOSIS — F411 Generalized anxiety disorder: Secondary | ICD-10-CM | POA: Diagnosis not present

## 2021-08-16 DIAGNOSIS — G301 Alzheimer's disease with late onset: Secondary | ICD-10-CM | POA: Diagnosis not present

## 2021-08-18 DIAGNOSIS — G309 Alzheimer's disease, unspecified: Secondary | ICD-10-CM | POA: Diagnosis not present

## 2021-08-18 DIAGNOSIS — R1312 Dysphagia, oropharyngeal phase: Secondary | ICD-10-CM | POA: Diagnosis not present

## 2021-08-20 DIAGNOSIS — R1312 Dysphagia, oropharyngeal phase: Secondary | ICD-10-CM | POA: Diagnosis not present

## 2021-08-20 DIAGNOSIS — G309 Alzheimer's disease, unspecified: Secondary | ICD-10-CM | POA: Diagnosis not present

## 2021-08-22 DIAGNOSIS — G309 Alzheimer's disease, unspecified: Secondary | ICD-10-CM | POA: Diagnosis not present

## 2021-08-22 DIAGNOSIS — R1312 Dysphagia, oropharyngeal phase: Secondary | ICD-10-CM | POA: Diagnosis not present

## 2021-08-23 DIAGNOSIS — F411 Generalized anxiety disorder: Secondary | ICD-10-CM | POA: Diagnosis not present

## 2021-08-23 DIAGNOSIS — F39 Unspecified mood [affective] disorder: Secondary | ICD-10-CM | POA: Diagnosis not present

## 2021-08-23 DIAGNOSIS — F0390 Unspecified dementia without behavioral disturbance: Secondary | ICD-10-CM | POA: Diagnosis not present

## 2021-08-23 DIAGNOSIS — G301 Alzheimer's disease with late onset: Secondary | ICD-10-CM | POA: Diagnosis not present

## 2021-08-23 DIAGNOSIS — F338 Other recurrent depressive disorders: Secondary | ICD-10-CM | POA: Diagnosis not present

## 2021-08-25 DIAGNOSIS — G309 Alzheimer's disease, unspecified: Secondary | ICD-10-CM | POA: Diagnosis not present

## 2021-08-25 DIAGNOSIS — R1312 Dysphagia, oropharyngeal phase: Secondary | ICD-10-CM | POA: Diagnosis not present

## 2021-08-26 DIAGNOSIS — R1312 Dysphagia, oropharyngeal phase: Secondary | ICD-10-CM | POA: Diagnosis not present

## 2021-08-26 DIAGNOSIS — G309 Alzheimer's disease, unspecified: Secondary | ICD-10-CM | POA: Diagnosis not present

## 2021-08-29 DIAGNOSIS — R1312 Dysphagia, oropharyngeal phase: Secondary | ICD-10-CM | POA: Diagnosis not present

## 2021-08-29 DIAGNOSIS — G309 Alzheimer's disease, unspecified: Secondary | ICD-10-CM | POA: Diagnosis not present

## 2021-09-01 DIAGNOSIS — G309 Alzheimer's disease, unspecified: Secondary | ICD-10-CM | POA: Diagnosis not present

## 2021-09-01 DIAGNOSIS — R1312 Dysphagia, oropharyngeal phase: Secondary | ICD-10-CM | POA: Diagnosis not present

## 2021-09-02 DIAGNOSIS — R1312 Dysphagia, oropharyngeal phase: Secondary | ICD-10-CM | POA: Diagnosis not present

## 2021-09-02 DIAGNOSIS — G309 Alzheimer's disease, unspecified: Secondary | ICD-10-CM | POA: Diagnosis not present

## 2021-09-03 DIAGNOSIS — R1312 Dysphagia, oropharyngeal phase: Secondary | ICD-10-CM | POA: Diagnosis not present

## 2021-09-03 DIAGNOSIS — G309 Alzheimer's disease, unspecified: Secondary | ICD-10-CM | POA: Diagnosis not present

## 2021-09-09 DIAGNOSIS — R1312 Dysphagia, oropharyngeal phase: Secondary | ICD-10-CM | POA: Diagnosis not present

## 2021-09-09 DIAGNOSIS — G309 Alzheimer's disease, unspecified: Secondary | ICD-10-CM | POA: Diagnosis not present

## 2021-09-10 DIAGNOSIS — R1312 Dysphagia, oropharyngeal phase: Secondary | ICD-10-CM | POA: Diagnosis not present

## 2021-09-10 DIAGNOSIS — G309 Alzheimer's disease, unspecified: Secondary | ICD-10-CM | POA: Diagnosis not present

## 2021-09-12 DIAGNOSIS — M79675 Pain in left toe(s): Secondary | ICD-10-CM | POA: Diagnosis not present

## 2021-09-12 DIAGNOSIS — B351 Tinea unguium: Secondary | ICD-10-CM | POA: Diagnosis not present

## 2021-09-12 DIAGNOSIS — M79674 Pain in right toe(s): Secondary | ICD-10-CM | POA: Diagnosis not present

## 2021-09-12 DIAGNOSIS — I739 Peripheral vascular disease, unspecified: Secondary | ICD-10-CM | POA: Diagnosis not present

## 2021-09-12 DIAGNOSIS — R262 Difficulty in walking, not elsewhere classified: Secondary | ICD-10-CM | POA: Diagnosis not present

## 2021-09-13 DIAGNOSIS — R1312 Dysphagia, oropharyngeal phase: Secondary | ICD-10-CM | POA: Diagnosis not present

## 2021-09-13 DIAGNOSIS — G309 Alzheimer's disease, unspecified: Secondary | ICD-10-CM | POA: Diagnosis not present

## 2021-09-14 DIAGNOSIS — F02811 Dementia in other diseases classified elsewhere, unspecified severity, with agitation: Secondary | ICD-10-CM | POA: Diagnosis not present

## 2021-09-14 DIAGNOSIS — F329 Major depressive disorder, single episode, unspecified: Secondary | ICD-10-CM | POA: Diagnosis not present

## 2021-09-14 DIAGNOSIS — G309 Alzheimer's disease, unspecified: Secondary | ICD-10-CM | POA: Diagnosis not present

## 2021-09-14 DIAGNOSIS — E039 Hypothyroidism, unspecified: Secondary | ICD-10-CM | POA: Diagnosis not present

## 2021-09-16 DIAGNOSIS — G309 Alzheimer's disease, unspecified: Secondary | ICD-10-CM | POA: Diagnosis not present

## 2021-09-16 DIAGNOSIS — R1312 Dysphagia, oropharyngeal phase: Secondary | ICD-10-CM | POA: Diagnosis not present

## 2021-09-19 DIAGNOSIS — G309 Alzheimer's disease, unspecified: Secondary | ICD-10-CM | POA: Diagnosis not present

## 2021-09-19 DIAGNOSIS — R1312 Dysphagia, oropharyngeal phase: Secondary | ICD-10-CM | POA: Diagnosis not present

## 2021-09-20 DIAGNOSIS — F0390 Unspecified dementia without behavioral disturbance: Secondary | ICD-10-CM | POA: Diagnosis not present

## 2021-09-20 DIAGNOSIS — F39 Unspecified mood [affective] disorder: Secondary | ICD-10-CM | POA: Diagnosis not present

## 2021-09-20 DIAGNOSIS — F338 Other recurrent depressive disorders: Secondary | ICD-10-CM | POA: Diagnosis not present

## 2021-09-20 DIAGNOSIS — F411 Generalized anxiety disorder: Secondary | ICD-10-CM | POA: Diagnosis not present

## 2021-09-20 DIAGNOSIS — G301 Alzheimer's disease with late onset: Secondary | ICD-10-CM | POA: Diagnosis not present

## 2021-09-21 DIAGNOSIS — G309 Alzheimer's disease, unspecified: Secondary | ICD-10-CM | POA: Diagnosis not present

## 2021-09-21 DIAGNOSIS — R1312 Dysphagia, oropharyngeal phase: Secondary | ICD-10-CM | POA: Diagnosis not present

## 2021-09-23 DIAGNOSIS — R1312 Dysphagia, oropharyngeal phase: Secondary | ICD-10-CM | POA: Diagnosis not present

## 2021-09-23 DIAGNOSIS — G309 Alzheimer's disease, unspecified: Secondary | ICD-10-CM | POA: Diagnosis not present

## 2021-09-26 DIAGNOSIS — R1312 Dysphagia, oropharyngeal phase: Secondary | ICD-10-CM | POA: Diagnosis not present

## 2021-09-26 DIAGNOSIS — G309 Alzheimer's disease, unspecified: Secondary | ICD-10-CM | POA: Diagnosis not present

## 2021-09-29 DIAGNOSIS — G309 Alzheimer's disease, unspecified: Secondary | ICD-10-CM | POA: Diagnosis not present

## 2021-09-29 DIAGNOSIS — R1312 Dysphagia, oropharyngeal phase: Secondary | ICD-10-CM | POA: Diagnosis not present

## 2021-09-30 DIAGNOSIS — G309 Alzheimer's disease, unspecified: Secondary | ICD-10-CM | POA: Diagnosis not present

## 2021-09-30 DIAGNOSIS — R1312 Dysphagia, oropharyngeal phase: Secondary | ICD-10-CM | POA: Diagnosis not present

## 2021-10-03 DIAGNOSIS — G309 Alzheimer's disease, unspecified: Secondary | ICD-10-CM | POA: Diagnosis not present

## 2021-10-03 DIAGNOSIS — R1312 Dysphagia, oropharyngeal phase: Secondary | ICD-10-CM | POA: Diagnosis not present

## 2021-10-04 DIAGNOSIS — G309 Alzheimer's disease, unspecified: Secondary | ICD-10-CM | POA: Diagnosis not present

## 2021-10-04 DIAGNOSIS — R1312 Dysphagia, oropharyngeal phase: Secondary | ICD-10-CM | POA: Diagnosis not present

## 2021-10-05 DIAGNOSIS — G309 Alzheimer's disease, unspecified: Secondary | ICD-10-CM | POA: Diagnosis not present

## 2021-10-05 DIAGNOSIS — R1312 Dysphagia, oropharyngeal phase: Secondary | ICD-10-CM | POA: Diagnosis not present

## 2021-11-01 DIAGNOSIS — F39 Unspecified mood [affective] disorder: Secondary | ICD-10-CM | POA: Diagnosis not present

## 2021-11-01 DIAGNOSIS — F338 Other recurrent depressive disorders: Secondary | ICD-10-CM | POA: Diagnosis not present

## 2021-11-01 DIAGNOSIS — G301 Alzheimer's disease with late onset: Secondary | ICD-10-CM | POA: Diagnosis not present

## 2021-11-01 DIAGNOSIS — F411 Generalized anxiety disorder: Secondary | ICD-10-CM | POA: Diagnosis not present

## 2021-11-01 DIAGNOSIS — F0390 Unspecified dementia without behavioral disturbance: Secondary | ICD-10-CM | POA: Diagnosis not present

## 2021-11-14 DIAGNOSIS — E876 Hypokalemia: Secondary | ICD-10-CM | POA: Diagnosis not present

## 2021-11-14 DIAGNOSIS — G309 Alzheimer's disease, unspecified: Secondary | ICD-10-CM | POA: Diagnosis not present

## 2021-11-14 DIAGNOSIS — F02818 Dementia in other diseases classified elsewhere, unspecified severity, with other behavioral disturbance: Secondary | ICD-10-CM | POA: Diagnosis not present

## 2021-11-14 DIAGNOSIS — K5900 Constipation, unspecified: Secondary | ICD-10-CM | POA: Diagnosis not present

## 2021-11-14 DIAGNOSIS — E039 Hypothyroidism, unspecified: Secondary | ICD-10-CM | POA: Diagnosis not present

## 2021-11-15 DIAGNOSIS — R7889 Finding of other specified substances, not normally found in blood: Secondary | ICD-10-CM | POA: Diagnosis not present

## 2021-11-15 DIAGNOSIS — R6889 Other general symptoms and signs: Secondary | ICD-10-CM | POA: Diagnosis not present

## 2021-11-15 DIAGNOSIS — R946 Abnormal results of thyroid function studies: Secondary | ICD-10-CM | POA: Diagnosis not present

## 2021-11-16 DIAGNOSIS — R41841 Cognitive communication deficit: Secondary | ICD-10-CM | POA: Diagnosis not present

## 2021-11-16 DIAGNOSIS — G309 Alzheimer's disease, unspecified: Secondary | ICD-10-CM | POA: Diagnosis not present

## 2021-11-16 DIAGNOSIS — F32A Depression, unspecified: Secondary | ICD-10-CM | POA: Diagnosis not present

## 2021-11-16 DIAGNOSIS — M6281 Muscle weakness (generalized): Secondary | ICD-10-CM | POA: Diagnosis not present

## 2021-11-16 DIAGNOSIS — R2689 Other abnormalities of gait and mobility: Secondary | ICD-10-CM | POA: Diagnosis not present

## 2021-11-16 DIAGNOSIS — R2681 Unsteadiness on feet: Secondary | ICD-10-CM | POA: Diagnosis not present

## 2021-11-17 DIAGNOSIS — M6281 Muscle weakness (generalized): Secondary | ICD-10-CM | POA: Diagnosis not present

## 2021-11-17 DIAGNOSIS — G309 Alzheimer's disease, unspecified: Secondary | ICD-10-CM | POA: Diagnosis not present

## 2021-11-17 DIAGNOSIS — R2689 Other abnormalities of gait and mobility: Secondary | ICD-10-CM | POA: Diagnosis not present

## 2021-11-17 DIAGNOSIS — R2681 Unsteadiness on feet: Secondary | ICD-10-CM | POA: Diagnosis not present

## 2021-11-17 DIAGNOSIS — F32A Depression, unspecified: Secondary | ICD-10-CM | POA: Diagnosis not present

## 2021-11-17 DIAGNOSIS — R41841 Cognitive communication deficit: Secondary | ICD-10-CM | POA: Diagnosis not present

## 2021-11-18 DIAGNOSIS — M6281 Muscle weakness (generalized): Secondary | ICD-10-CM | POA: Diagnosis not present

## 2021-11-18 DIAGNOSIS — R2681 Unsteadiness on feet: Secondary | ICD-10-CM | POA: Diagnosis not present

## 2021-11-18 DIAGNOSIS — R2689 Other abnormalities of gait and mobility: Secondary | ICD-10-CM | POA: Diagnosis not present

## 2021-11-18 DIAGNOSIS — R41841 Cognitive communication deficit: Secondary | ICD-10-CM | POA: Diagnosis not present

## 2021-11-18 DIAGNOSIS — F32A Depression, unspecified: Secondary | ICD-10-CM | POA: Diagnosis not present

## 2021-11-18 DIAGNOSIS — G309 Alzheimer's disease, unspecified: Secondary | ICD-10-CM | POA: Diagnosis not present

## 2021-11-21 DIAGNOSIS — R2689 Other abnormalities of gait and mobility: Secondary | ICD-10-CM | POA: Diagnosis not present

## 2021-11-21 DIAGNOSIS — M6281 Muscle weakness (generalized): Secondary | ICD-10-CM | POA: Diagnosis not present

## 2021-11-21 DIAGNOSIS — G309 Alzheimer's disease, unspecified: Secondary | ICD-10-CM | POA: Diagnosis not present

## 2021-11-21 DIAGNOSIS — R41841 Cognitive communication deficit: Secondary | ICD-10-CM | POA: Diagnosis not present

## 2021-11-21 DIAGNOSIS — R2681 Unsteadiness on feet: Secondary | ICD-10-CM | POA: Diagnosis not present

## 2021-11-21 DIAGNOSIS — Z79899 Other long term (current) drug therapy: Secondary | ICD-10-CM | POA: Diagnosis not present

## 2021-11-21 DIAGNOSIS — E039 Hypothyroidism, unspecified: Secondary | ICD-10-CM | POA: Diagnosis not present

## 2021-11-21 DIAGNOSIS — F32A Depression, unspecified: Secondary | ICD-10-CM | POA: Diagnosis not present

## 2021-11-22 DIAGNOSIS — G309 Alzheimer's disease, unspecified: Secondary | ICD-10-CM | POA: Diagnosis not present

## 2021-11-22 DIAGNOSIS — F32A Depression, unspecified: Secondary | ICD-10-CM | POA: Diagnosis not present

## 2021-11-22 DIAGNOSIS — R2681 Unsteadiness on feet: Secondary | ICD-10-CM | POA: Diagnosis not present

## 2021-11-22 DIAGNOSIS — R2689 Other abnormalities of gait and mobility: Secondary | ICD-10-CM | POA: Diagnosis not present

## 2021-11-22 DIAGNOSIS — R41841 Cognitive communication deficit: Secondary | ICD-10-CM | POA: Diagnosis not present

## 2021-11-22 DIAGNOSIS — M6281 Muscle weakness (generalized): Secondary | ICD-10-CM | POA: Diagnosis not present

## 2021-11-23 DIAGNOSIS — R41841 Cognitive communication deficit: Secondary | ICD-10-CM | POA: Diagnosis not present

## 2021-11-23 DIAGNOSIS — R2681 Unsteadiness on feet: Secondary | ICD-10-CM | POA: Diagnosis not present

## 2021-11-23 DIAGNOSIS — G309 Alzheimer's disease, unspecified: Secondary | ICD-10-CM | POA: Diagnosis not present

## 2021-11-23 DIAGNOSIS — F32A Depression, unspecified: Secondary | ICD-10-CM | POA: Diagnosis not present

## 2021-11-23 DIAGNOSIS — M6281 Muscle weakness (generalized): Secondary | ICD-10-CM | POA: Diagnosis not present

## 2021-11-23 DIAGNOSIS — R2689 Other abnormalities of gait and mobility: Secondary | ICD-10-CM | POA: Diagnosis not present

## 2021-11-24 DIAGNOSIS — R2681 Unsteadiness on feet: Secondary | ICD-10-CM | POA: Diagnosis not present

## 2021-11-24 DIAGNOSIS — F32A Depression, unspecified: Secondary | ICD-10-CM | POA: Diagnosis not present

## 2021-11-24 DIAGNOSIS — M6281 Muscle weakness (generalized): Secondary | ICD-10-CM | POA: Diagnosis not present

## 2021-11-24 DIAGNOSIS — R41841 Cognitive communication deficit: Secondary | ICD-10-CM | POA: Diagnosis not present

## 2021-11-24 DIAGNOSIS — G309 Alzheimer's disease, unspecified: Secondary | ICD-10-CM | POA: Diagnosis not present

## 2021-11-24 DIAGNOSIS — R2689 Other abnormalities of gait and mobility: Secondary | ICD-10-CM | POA: Diagnosis not present

## 2021-11-25 DIAGNOSIS — G309 Alzheimer's disease, unspecified: Secondary | ICD-10-CM | POA: Diagnosis not present

## 2021-11-25 DIAGNOSIS — F32A Depression, unspecified: Secondary | ICD-10-CM | POA: Diagnosis not present

## 2021-11-25 DIAGNOSIS — R2681 Unsteadiness on feet: Secondary | ICD-10-CM | POA: Diagnosis not present

## 2021-11-25 DIAGNOSIS — R41841 Cognitive communication deficit: Secondary | ICD-10-CM | POA: Diagnosis not present

## 2021-11-25 DIAGNOSIS — R2689 Other abnormalities of gait and mobility: Secondary | ICD-10-CM | POA: Diagnosis not present

## 2021-11-25 DIAGNOSIS — M6281 Muscle weakness (generalized): Secondary | ICD-10-CM | POA: Diagnosis not present

## 2021-11-28 DIAGNOSIS — R2681 Unsteadiness on feet: Secondary | ICD-10-CM | POA: Diagnosis not present

## 2021-11-28 DIAGNOSIS — R41841 Cognitive communication deficit: Secondary | ICD-10-CM | POA: Diagnosis not present

## 2021-11-28 DIAGNOSIS — G309 Alzheimer's disease, unspecified: Secondary | ICD-10-CM | POA: Diagnosis not present

## 2021-11-28 DIAGNOSIS — F32A Depression, unspecified: Secondary | ICD-10-CM | POA: Diagnosis not present

## 2021-11-28 DIAGNOSIS — M6281 Muscle weakness (generalized): Secondary | ICD-10-CM | POA: Diagnosis not present

## 2021-11-28 DIAGNOSIS — R2689 Other abnormalities of gait and mobility: Secondary | ICD-10-CM | POA: Diagnosis not present

## 2021-11-29 DIAGNOSIS — R41841 Cognitive communication deficit: Secondary | ICD-10-CM | POA: Diagnosis not present

## 2021-11-29 DIAGNOSIS — M6281 Muscle weakness (generalized): Secondary | ICD-10-CM | POA: Diagnosis not present

## 2021-11-29 DIAGNOSIS — R2681 Unsteadiness on feet: Secondary | ICD-10-CM | POA: Diagnosis not present

## 2021-11-29 DIAGNOSIS — F32A Depression, unspecified: Secondary | ICD-10-CM | POA: Diagnosis not present

## 2021-11-29 DIAGNOSIS — R2689 Other abnormalities of gait and mobility: Secondary | ICD-10-CM | POA: Diagnosis not present

## 2021-11-29 DIAGNOSIS — G309 Alzheimer's disease, unspecified: Secondary | ICD-10-CM | POA: Diagnosis not present

## 2021-11-30 DIAGNOSIS — R41841 Cognitive communication deficit: Secondary | ICD-10-CM | POA: Diagnosis not present

## 2021-11-30 DIAGNOSIS — R2681 Unsteadiness on feet: Secondary | ICD-10-CM | POA: Diagnosis not present

## 2021-11-30 DIAGNOSIS — R2689 Other abnormalities of gait and mobility: Secondary | ICD-10-CM | POA: Diagnosis not present

## 2021-11-30 DIAGNOSIS — G309 Alzheimer's disease, unspecified: Secondary | ICD-10-CM | POA: Diagnosis not present

## 2021-11-30 DIAGNOSIS — F32A Depression, unspecified: Secondary | ICD-10-CM | POA: Diagnosis not present

## 2021-11-30 DIAGNOSIS — M6281 Muscle weakness (generalized): Secondary | ICD-10-CM | POA: Diagnosis not present

## 2021-12-01 DIAGNOSIS — R2689 Other abnormalities of gait and mobility: Secondary | ICD-10-CM | POA: Diagnosis not present

## 2021-12-01 DIAGNOSIS — R2681 Unsteadiness on feet: Secondary | ICD-10-CM | POA: Diagnosis not present

## 2021-12-01 DIAGNOSIS — M6281 Muscle weakness (generalized): Secondary | ICD-10-CM | POA: Diagnosis not present

## 2021-12-01 DIAGNOSIS — R41841 Cognitive communication deficit: Secondary | ICD-10-CM | POA: Diagnosis not present

## 2021-12-01 DIAGNOSIS — F32A Depression, unspecified: Secondary | ICD-10-CM | POA: Diagnosis not present

## 2021-12-01 DIAGNOSIS — G309 Alzheimer's disease, unspecified: Secondary | ICD-10-CM | POA: Diagnosis not present

## 2021-12-02 DIAGNOSIS — M6281 Muscle weakness (generalized): Secondary | ICD-10-CM | POA: Diagnosis not present

## 2021-12-02 DIAGNOSIS — R41841 Cognitive communication deficit: Secondary | ICD-10-CM | POA: Diagnosis not present

## 2021-12-02 DIAGNOSIS — G309 Alzheimer's disease, unspecified: Secondary | ICD-10-CM | POA: Diagnosis not present

## 2021-12-02 DIAGNOSIS — F32A Depression, unspecified: Secondary | ICD-10-CM | POA: Diagnosis not present

## 2021-12-02 DIAGNOSIS — R2681 Unsteadiness on feet: Secondary | ICD-10-CM | POA: Diagnosis not present

## 2021-12-02 DIAGNOSIS — R2689 Other abnormalities of gait and mobility: Secondary | ICD-10-CM | POA: Diagnosis not present

## 2021-12-05 DIAGNOSIS — M6281 Muscle weakness (generalized): Secondary | ICD-10-CM | POA: Diagnosis not present

## 2021-12-05 DIAGNOSIS — F32A Depression, unspecified: Secondary | ICD-10-CM | POA: Diagnosis not present

## 2021-12-05 DIAGNOSIS — G309 Alzheimer's disease, unspecified: Secondary | ICD-10-CM | POA: Diagnosis not present

## 2021-12-05 DIAGNOSIS — R2689 Other abnormalities of gait and mobility: Secondary | ICD-10-CM | POA: Diagnosis not present

## 2021-12-05 DIAGNOSIS — R2681 Unsteadiness on feet: Secondary | ICD-10-CM | POA: Diagnosis not present

## 2021-12-05 DIAGNOSIS — R41841 Cognitive communication deficit: Secondary | ICD-10-CM | POA: Diagnosis not present

## 2021-12-07 DIAGNOSIS — F32A Depression, unspecified: Secondary | ICD-10-CM | POA: Diagnosis not present

## 2021-12-07 DIAGNOSIS — E876 Hypokalemia: Secondary | ICD-10-CM | POA: Diagnosis not present

## 2021-12-07 DIAGNOSIS — R2689 Other abnormalities of gait and mobility: Secondary | ICD-10-CM | POA: Diagnosis not present

## 2021-12-07 DIAGNOSIS — R41841 Cognitive communication deficit: Secondary | ICD-10-CM | POA: Diagnosis not present

## 2021-12-07 DIAGNOSIS — R410 Disorientation, unspecified: Secondary | ICD-10-CM | POA: Diagnosis not present

## 2021-12-07 DIAGNOSIS — F02818 Dementia in other diseases classified elsewhere, unspecified severity, with other behavioral disturbance: Secondary | ICD-10-CM | POA: Diagnosis not present

## 2021-12-07 DIAGNOSIS — M6281 Muscle weakness (generalized): Secondary | ICD-10-CM | POA: Diagnosis not present

## 2021-12-07 DIAGNOSIS — G309 Alzheimer's disease, unspecified: Secondary | ICD-10-CM | POA: Diagnosis not present

## 2021-12-07 DIAGNOSIS — R2681 Unsteadiness on feet: Secondary | ICD-10-CM | POA: Diagnosis not present

## 2021-12-07 DIAGNOSIS — K5900 Constipation, unspecified: Secondary | ICD-10-CM | POA: Diagnosis not present

## 2021-12-07 DIAGNOSIS — E039 Hypothyroidism, unspecified: Secondary | ICD-10-CM | POA: Diagnosis not present

## 2022-01-19 DIAGNOSIS — F0390 Unspecified dementia without behavioral disturbance: Secondary | ICD-10-CM | POA: Diagnosis not present

## 2022-02-06 DIAGNOSIS — G309 Alzheimer's disease, unspecified: Secondary | ICD-10-CM | POA: Diagnosis not present

## 2022-02-06 DIAGNOSIS — R2681 Unsteadiness on feet: Secondary | ICD-10-CM | POA: Diagnosis not present

## 2022-02-06 DIAGNOSIS — R269 Unspecified abnormalities of gait and mobility: Secondary | ICD-10-CM | POA: Diagnosis not present

## 2022-02-06 DIAGNOSIS — F32A Depression, unspecified: Secondary | ICD-10-CM | POA: Diagnosis not present

## 2022-02-06 DIAGNOSIS — F02818 Dementia in other diseases classified elsewhere, unspecified severity, with other behavioral disturbance: Secondary | ICD-10-CM | POA: Diagnosis not present

## 2022-02-06 DIAGNOSIS — M6281 Muscle weakness (generalized): Secondary | ICD-10-CM | POA: Diagnosis not present

## 2022-02-06 DIAGNOSIS — R41841 Cognitive communication deficit: Secondary | ICD-10-CM | POA: Diagnosis not present

## 2022-02-06 DIAGNOSIS — R2689 Other abnormalities of gait and mobility: Secondary | ICD-10-CM | POA: Diagnosis not present

## 2022-02-09 DIAGNOSIS — G309 Alzheimer's disease, unspecified: Secondary | ICD-10-CM | POA: Diagnosis not present

## 2022-02-09 DIAGNOSIS — R269 Unspecified abnormalities of gait and mobility: Secondary | ICD-10-CM | POA: Diagnosis not present

## 2022-02-09 DIAGNOSIS — M6281 Muscle weakness (generalized): Secondary | ICD-10-CM | POA: Diagnosis not present

## 2022-02-09 DIAGNOSIS — F02818 Dementia in other diseases classified elsewhere, unspecified severity, with other behavioral disturbance: Secondary | ICD-10-CM | POA: Diagnosis not present

## 2022-02-10 DIAGNOSIS — G309 Alzheimer's disease, unspecified: Secondary | ICD-10-CM | POA: Diagnosis not present

## 2022-02-10 DIAGNOSIS — F02818 Dementia in other diseases classified elsewhere, unspecified severity, with other behavioral disturbance: Secondary | ICD-10-CM | POA: Diagnosis not present

## 2022-02-10 DIAGNOSIS — R269 Unspecified abnormalities of gait and mobility: Secondary | ICD-10-CM | POA: Diagnosis not present

## 2022-02-10 DIAGNOSIS — M6281 Muscle weakness (generalized): Secondary | ICD-10-CM | POA: Diagnosis not present

## 2022-02-13 DIAGNOSIS — G309 Alzheimer's disease, unspecified: Secondary | ICD-10-CM | POA: Diagnosis not present

## 2022-02-13 DIAGNOSIS — M6281 Muscle weakness (generalized): Secondary | ICD-10-CM | POA: Diagnosis not present

## 2022-02-13 DIAGNOSIS — F02818 Dementia in other diseases classified elsewhere, unspecified severity, with other behavioral disturbance: Secondary | ICD-10-CM | POA: Diagnosis not present

## 2022-02-13 DIAGNOSIS — R269 Unspecified abnormalities of gait and mobility: Secondary | ICD-10-CM | POA: Diagnosis not present

## 2022-02-14 DIAGNOSIS — F02818 Dementia in other diseases classified elsewhere, unspecified severity, with other behavioral disturbance: Secondary | ICD-10-CM | POA: Diagnosis not present

## 2022-02-14 DIAGNOSIS — R269 Unspecified abnormalities of gait and mobility: Secondary | ICD-10-CM | POA: Diagnosis not present

## 2022-02-14 DIAGNOSIS — M6281 Muscle weakness (generalized): Secondary | ICD-10-CM | POA: Diagnosis not present

## 2022-02-14 DIAGNOSIS — G309 Alzheimer's disease, unspecified: Secondary | ICD-10-CM | POA: Diagnosis not present

## 2022-02-15 DIAGNOSIS — F02818 Dementia in other diseases classified elsewhere, unspecified severity, with other behavioral disturbance: Secondary | ICD-10-CM | POA: Diagnosis not present

## 2022-02-15 DIAGNOSIS — M6281 Muscle weakness (generalized): Secondary | ICD-10-CM | POA: Diagnosis not present

## 2022-02-15 DIAGNOSIS — R269 Unspecified abnormalities of gait and mobility: Secondary | ICD-10-CM | POA: Diagnosis not present

## 2022-02-15 DIAGNOSIS — G309 Alzheimer's disease, unspecified: Secondary | ICD-10-CM | POA: Diagnosis not present

## 2022-02-15 DIAGNOSIS — E039 Hypothyroidism, unspecified: Secondary | ICD-10-CM | POA: Diagnosis not present

## 2022-02-24 DIAGNOSIS — G309 Alzheimer's disease, unspecified: Secondary | ICD-10-CM | POA: Diagnosis not present

## 2022-02-24 DIAGNOSIS — E039 Hypothyroidism, unspecified: Secondary | ICD-10-CM | POA: Diagnosis not present

## 2022-04-26 DIAGNOSIS — G309 Alzheimer's disease, unspecified: Secondary | ICD-10-CM | POA: Diagnosis not present

## 2022-04-26 DIAGNOSIS — R2689 Other abnormalities of gait and mobility: Secondary | ICD-10-CM | POA: Diagnosis not present

## 2022-04-26 DIAGNOSIS — R41841 Cognitive communication deficit: Secondary | ICD-10-CM | POA: Diagnosis not present

## 2022-04-27 DIAGNOSIS — R2689 Other abnormalities of gait and mobility: Secondary | ICD-10-CM | POA: Diagnosis not present

## 2022-04-27 DIAGNOSIS — G309 Alzheimer's disease, unspecified: Secondary | ICD-10-CM | POA: Diagnosis not present

## 2022-04-27 DIAGNOSIS — R410 Disorientation, unspecified: Secondary | ICD-10-CM | POA: Diagnosis not present

## 2022-04-27 DIAGNOSIS — E039 Hypothyroidism, unspecified: Secondary | ICD-10-CM | POA: Diagnosis not present

## 2022-04-27 DIAGNOSIS — R41841 Cognitive communication deficit: Secondary | ICD-10-CM | POA: Diagnosis not present

## 2022-04-28 DIAGNOSIS — R2689 Other abnormalities of gait and mobility: Secondary | ICD-10-CM | POA: Diagnosis not present

## 2022-04-28 DIAGNOSIS — R41841 Cognitive communication deficit: Secondary | ICD-10-CM | POA: Diagnosis not present

## 2022-04-28 DIAGNOSIS — G309 Alzheimer's disease, unspecified: Secondary | ICD-10-CM | POA: Diagnosis not present

## 2022-05-01 DIAGNOSIS — R2689 Other abnormalities of gait and mobility: Secondary | ICD-10-CM | POA: Diagnosis not present

## 2022-05-01 DIAGNOSIS — R41841 Cognitive communication deficit: Secondary | ICD-10-CM | POA: Diagnosis not present

## 2022-05-01 DIAGNOSIS — G309 Alzheimer's disease, unspecified: Secondary | ICD-10-CM | POA: Diagnosis not present

## 2022-05-02 DIAGNOSIS — R41841 Cognitive communication deficit: Secondary | ICD-10-CM | POA: Diagnosis not present

## 2022-05-02 DIAGNOSIS — R2689 Other abnormalities of gait and mobility: Secondary | ICD-10-CM | POA: Diagnosis not present

## 2022-05-02 DIAGNOSIS — G309 Alzheimer's disease, unspecified: Secondary | ICD-10-CM | POA: Diagnosis not present

## 2022-05-03 DIAGNOSIS — R2689 Other abnormalities of gait and mobility: Secondary | ICD-10-CM | POA: Diagnosis not present

## 2022-05-03 DIAGNOSIS — G309 Alzheimer's disease, unspecified: Secondary | ICD-10-CM | POA: Diagnosis not present

## 2022-05-03 DIAGNOSIS — R41841 Cognitive communication deficit: Secondary | ICD-10-CM | POA: Diagnosis not present

## 2022-05-04 DIAGNOSIS — Z23 Encounter for immunization: Secondary | ICD-10-CM | POA: Diagnosis not present

## 2022-05-18 DIAGNOSIS — R6889 Other general symptoms and signs: Secondary | ICD-10-CM | POA: Diagnosis not present

## 2022-05-18 DIAGNOSIS — R946 Abnormal results of thyroid function studies: Secondary | ICD-10-CM | POA: Diagnosis not present

## 2022-05-18 DIAGNOSIS — Z13228 Encounter for screening for other metabolic disorders: Secondary | ICD-10-CM | POA: Diagnosis not present

## 2022-05-18 DIAGNOSIS — Z5181 Encounter for therapeutic drug level monitoring: Secondary | ICD-10-CM | POA: Diagnosis not present

## 2022-06-09 DIAGNOSIS — E039 Hypothyroidism, unspecified: Secondary | ICD-10-CM | POA: Diagnosis not present

## 2022-06-09 DIAGNOSIS — R410 Disorientation, unspecified: Secondary | ICD-10-CM | POA: Diagnosis not present

## 2022-06-09 DIAGNOSIS — R799 Abnormal finding of blood chemistry, unspecified: Secondary | ICD-10-CM | POA: Diagnosis not present

## 2022-06-29 DIAGNOSIS — R410 Disorientation, unspecified: Secondary | ICD-10-CM | POA: Diagnosis not present

## 2022-06-29 DIAGNOSIS — G309 Alzheimer's disease, unspecified: Secondary | ICD-10-CM | POA: Diagnosis not present

## 2022-06-29 DIAGNOSIS — E039 Hypothyroidism, unspecified: Secondary | ICD-10-CM | POA: Diagnosis not present

## 2022-07-18 DIAGNOSIS — E039 Hypothyroidism, unspecified: Secondary | ICD-10-CM | POA: Diagnosis not present

## 2022-07-18 DIAGNOSIS — M6281 Muscle weakness (generalized): Secondary | ICD-10-CM | POA: Diagnosis not present

## 2022-07-25 DIAGNOSIS — E039 Hypothyroidism, unspecified: Secondary | ICD-10-CM | POA: Diagnosis not present

## 2022-07-25 DIAGNOSIS — E876 Hypokalemia: Secondary | ICD-10-CM | POA: Diagnosis not present

## 2022-07-26 DIAGNOSIS — Z741 Need for assistance with personal care: Secondary | ICD-10-CM | POA: Diagnosis not present

## 2022-07-26 DIAGNOSIS — M6281 Muscle weakness (generalized): Secondary | ICD-10-CM | POA: Diagnosis not present

## 2022-07-26 DIAGNOSIS — G309 Alzheimer's disease, unspecified: Secondary | ICD-10-CM | POA: Diagnosis not present

## 2022-07-27 DIAGNOSIS — M6281 Muscle weakness (generalized): Secondary | ICD-10-CM | POA: Diagnosis not present

## 2022-07-27 DIAGNOSIS — G309 Alzheimer's disease, unspecified: Secondary | ICD-10-CM | POA: Diagnosis not present

## 2022-07-27 DIAGNOSIS — Z741 Need for assistance with personal care: Secondary | ICD-10-CM | POA: Diagnosis not present

## 2022-07-28 DIAGNOSIS — M6281 Muscle weakness (generalized): Secondary | ICD-10-CM | POA: Diagnosis not present

## 2022-07-28 DIAGNOSIS — Z741 Need for assistance with personal care: Secondary | ICD-10-CM | POA: Diagnosis not present

## 2022-07-28 DIAGNOSIS — G309 Alzheimer's disease, unspecified: Secondary | ICD-10-CM | POA: Diagnosis not present

## 2022-07-31 DIAGNOSIS — Z741 Need for assistance with personal care: Secondary | ICD-10-CM | POA: Diagnosis not present

## 2022-07-31 DIAGNOSIS — M6281 Muscle weakness (generalized): Secondary | ICD-10-CM | POA: Diagnosis not present

## 2022-07-31 DIAGNOSIS — G309 Alzheimer's disease, unspecified: Secondary | ICD-10-CM | POA: Diagnosis not present

## 2022-08-01 DIAGNOSIS — M6281 Muscle weakness (generalized): Secondary | ICD-10-CM | POA: Diagnosis not present

## 2022-08-01 DIAGNOSIS — Z741 Need for assistance with personal care: Secondary | ICD-10-CM | POA: Diagnosis not present

## 2022-08-01 DIAGNOSIS — G309 Alzheimer's disease, unspecified: Secondary | ICD-10-CM | POA: Diagnosis not present

## 2022-08-02 DIAGNOSIS — G309 Alzheimer's disease, unspecified: Secondary | ICD-10-CM | POA: Diagnosis not present

## 2022-08-02 DIAGNOSIS — Z741 Need for assistance with personal care: Secondary | ICD-10-CM | POA: Diagnosis not present

## 2022-08-02 DIAGNOSIS — M6281 Muscle weakness (generalized): Secondary | ICD-10-CM | POA: Diagnosis not present

## 2022-08-18 DIAGNOSIS — Z5181 Encounter for therapeutic drug level monitoring: Secondary | ICD-10-CM | POA: Diagnosis not present

## 2022-08-18 DIAGNOSIS — E876 Hypokalemia: Secondary | ICD-10-CM | POA: Diagnosis not present

## 2022-08-18 DIAGNOSIS — E039 Hypothyroidism, unspecified: Secondary | ICD-10-CM | POA: Diagnosis not present

## 2022-08-18 DIAGNOSIS — F0283 Dementia in other diseases classified elsewhere, unspecified severity, with mood disturbance: Secondary | ICD-10-CM | POA: Diagnosis not present

## 2022-08-31 DIAGNOSIS — G309 Alzheimer's disease, unspecified: Secondary | ICD-10-CM | POA: Diagnosis not present

## 2022-08-31 DIAGNOSIS — E039 Hypothyroidism, unspecified: Secondary | ICD-10-CM | POA: Diagnosis not present

## 2022-09-20 DIAGNOSIS — E039 Hypothyroidism, unspecified: Secondary | ICD-10-CM | POA: Diagnosis not present

## 2022-10-06 DIAGNOSIS — E039 Hypothyroidism, unspecified: Secondary | ICD-10-CM | POA: Diagnosis not present

## 2022-10-18 DIAGNOSIS — G309 Alzheimer's disease, unspecified: Secondary | ICD-10-CM | POA: Diagnosis not present

## 2022-10-18 DIAGNOSIS — Z23 Encounter for immunization: Secondary | ICD-10-CM | POA: Diagnosis not present

## 2022-10-18 DIAGNOSIS — R41841 Cognitive communication deficit: Secondary | ICD-10-CM | POA: Diagnosis not present

## 2022-10-19 DIAGNOSIS — R41841 Cognitive communication deficit: Secondary | ICD-10-CM | POA: Diagnosis not present

## 2022-10-19 DIAGNOSIS — G309 Alzheimer's disease, unspecified: Secondary | ICD-10-CM | POA: Diagnosis not present

## 2022-10-20 DIAGNOSIS — G309 Alzheimer's disease, unspecified: Secondary | ICD-10-CM | POA: Diagnosis not present

## 2022-10-20 DIAGNOSIS — R41841 Cognitive communication deficit: Secondary | ICD-10-CM | POA: Diagnosis not present

## 2022-10-23 DIAGNOSIS — R41841 Cognitive communication deficit: Secondary | ICD-10-CM | POA: Diagnosis not present

## 2022-10-23 DIAGNOSIS — G309 Alzheimer's disease, unspecified: Secondary | ICD-10-CM | POA: Diagnosis not present

## 2022-10-24 DIAGNOSIS — R41841 Cognitive communication deficit: Secondary | ICD-10-CM | POA: Diagnosis not present

## 2022-10-24 DIAGNOSIS — G309 Alzheimer's disease, unspecified: Secondary | ICD-10-CM | POA: Diagnosis not present

## 2022-10-25 DIAGNOSIS — G309 Alzheimer's disease, unspecified: Secondary | ICD-10-CM | POA: Diagnosis not present

## 2022-10-25 DIAGNOSIS — R41841 Cognitive communication deficit: Secondary | ICD-10-CM | POA: Diagnosis not present

## 2022-10-31 DIAGNOSIS — E876 Hypokalemia: Secondary | ICD-10-CM | POA: Diagnosis not present

## 2022-10-31 DIAGNOSIS — G309 Alzheimer's disease, unspecified: Secondary | ICD-10-CM | POA: Diagnosis not present

## 2022-10-31 DIAGNOSIS — K5900 Constipation, unspecified: Secondary | ICD-10-CM | POA: Diagnosis not present

## 2022-10-31 DIAGNOSIS — E039 Hypothyroidism, unspecified: Secondary | ICD-10-CM | POA: Diagnosis not present

## 2022-11-16 DIAGNOSIS — E039 Hypothyroidism, unspecified: Secondary | ICD-10-CM | POA: Diagnosis not present

## 2022-12-21 DIAGNOSIS — R41841 Cognitive communication deficit: Secondary | ICD-10-CM | POA: Diagnosis not present

## 2022-12-21 DIAGNOSIS — G309 Alzheimer's disease, unspecified: Secondary | ICD-10-CM | POA: Diagnosis not present

## 2022-12-25 DIAGNOSIS — G309 Alzheimer's disease, unspecified: Secondary | ICD-10-CM | POA: Diagnosis not present

## 2022-12-25 DIAGNOSIS — R41841 Cognitive communication deficit: Secondary | ICD-10-CM | POA: Diagnosis not present

## 2022-12-26 DIAGNOSIS — R41841 Cognitive communication deficit: Secondary | ICD-10-CM | POA: Diagnosis not present

## 2022-12-26 DIAGNOSIS — G309 Alzheimer's disease, unspecified: Secondary | ICD-10-CM | POA: Diagnosis not present

## 2022-12-27 DIAGNOSIS — R41841 Cognitive communication deficit: Secondary | ICD-10-CM | POA: Diagnosis not present

## 2022-12-27 DIAGNOSIS — G309 Alzheimer's disease, unspecified: Secondary | ICD-10-CM | POA: Diagnosis not present

## 2022-12-29 DIAGNOSIS — R41841 Cognitive communication deficit: Secondary | ICD-10-CM | POA: Diagnosis not present

## 2022-12-29 DIAGNOSIS — G309 Alzheimer's disease, unspecified: Secondary | ICD-10-CM | POA: Diagnosis not present

## 2022-12-30 DIAGNOSIS — G309 Alzheimer's disease, unspecified: Secondary | ICD-10-CM | POA: Diagnosis not present

## 2022-12-30 DIAGNOSIS — R41841 Cognitive communication deficit: Secondary | ICD-10-CM | POA: Diagnosis not present

## 2023-01-01 DIAGNOSIS — G309 Alzheimer's disease, unspecified: Secondary | ICD-10-CM | POA: Diagnosis not present

## 2023-01-01 DIAGNOSIS — R41841 Cognitive communication deficit: Secondary | ICD-10-CM | POA: Diagnosis not present

## 2023-01-02 DIAGNOSIS — R41841 Cognitive communication deficit: Secondary | ICD-10-CM | POA: Diagnosis not present

## 2023-01-02 DIAGNOSIS — G309 Alzheimer's disease, unspecified: Secondary | ICD-10-CM | POA: Diagnosis not present

## 2023-01-03 DIAGNOSIS — R41841 Cognitive communication deficit: Secondary | ICD-10-CM | POA: Diagnosis not present

## 2023-01-03 DIAGNOSIS — G309 Alzheimer's disease, unspecified: Secondary | ICD-10-CM | POA: Diagnosis not present

## 2023-01-04 DIAGNOSIS — R41841 Cognitive communication deficit: Secondary | ICD-10-CM | POA: Diagnosis not present

## 2023-01-04 DIAGNOSIS — G309 Alzheimer's disease, unspecified: Secondary | ICD-10-CM | POA: Diagnosis not present

## 2023-01-05 DIAGNOSIS — E876 Hypokalemia: Secondary | ICD-10-CM | POA: Diagnosis not present

## 2023-01-05 DIAGNOSIS — R41841 Cognitive communication deficit: Secondary | ICD-10-CM | POA: Diagnosis not present

## 2023-01-05 DIAGNOSIS — E039 Hypothyroidism, unspecified: Secondary | ICD-10-CM | POA: Diagnosis not present

## 2023-01-05 DIAGNOSIS — G309 Alzheimer's disease, unspecified: Secondary | ICD-10-CM | POA: Diagnosis not present

## 2023-01-05 DIAGNOSIS — K5900 Constipation, unspecified: Secondary | ICD-10-CM | POA: Diagnosis not present

## 2023-01-08 DIAGNOSIS — G309 Alzheimer's disease, unspecified: Secondary | ICD-10-CM | POA: Diagnosis not present

## 2023-01-08 DIAGNOSIS — R41841 Cognitive communication deficit: Secondary | ICD-10-CM | POA: Diagnosis not present

## 2023-02-16 DIAGNOSIS — E876 Hypokalemia: Secondary | ICD-10-CM | POA: Diagnosis not present

## 2023-02-16 DIAGNOSIS — E039 Hypothyroidism, unspecified: Secondary | ICD-10-CM | POA: Diagnosis not present

## 2023-02-16 DIAGNOSIS — F0283 Dementia in other diseases classified elsewhere, unspecified severity, with mood disturbance: Secondary | ICD-10-CM | POA: Diagnosis not present

## 2023-03-08 DIAGNOSIS — G309 Alzheimer's disease, unspecified: Secondary | ICD-10-CM | POA: Diagnosis not present

## 2023-03-08 DIAGNOSIS — K5900 Constipation, unspecified: Secondary | ICD-10-CM | POA: Diagnosis not present

## 2023-03-08 DIAGNOSIS — E876 Hypokalemia: Secondary | ICD-10-CM | POA: Diagnosis not present

## 2023-03-08 DIAGNOSIS — F02818 Dementia in other diseases classified elsewhere, unspecified severity, with other behavioral disturbance: Secondary | ICD-10-CM | POA: Diagnosis not present

## 2023-03-08 DIAGNOSIS — E039 Hypothyroidism, unspecified: Secondary | ICD-10-CM | POA: Diagnosis not present

## 2023-03-08 DIAGNOSIS — F329 Major depressive disorder, single episode, unspecified: Secondary | ICD-10-CM | POA: Diagnosis not present

## 2023-03-16 DIAGNOSIS — G309 Alzheimer's disease, unspecified: Secondary | ICD-10-CM | POA: Diagnosis not present

## 2023-03-16 DIAGNOSIS — R1310 Dysphagia, unspecified: Secondary | ICD-10-CM | POA: Diagnosis not present

## 2023-03-16 DIAGNOSIS — R41841 Cognitive communication deficit: Secondary | ICD-10-CM | POA: Diagnosis not present

## 2023-03-19 DIAGNOSIS — R41841 Cognitive communication deficit: Secondary | ICD-10-CM | POA: Diagnosis not present

## 2023-03-19 DIAGNOSIS — G309 Alzheimer's disease, unspecified: Secondary | ICD-10-CM | POA: Diagnosis not present

## 2023-03-19 DIAGNOSIS — R1310 Dysphagia, unspecified: Secondary | ICD-10-CM | POA: Diagnosis not present

## 2023-03-20 DIAGNOSIS — R1310 Dysphagia, unspecified: Secondary | ICD-10-CM | POA: Diagnosis not present

## 2023-03-20 DIAGNOSIS — G309 Alzheimer's disease, unspecified: Secondary | ICD-10-CM | POA: Diagnosis not present

## 2023-03-20 DIAGNOSIS — R41841 Cognitive communication deficit: Secondary | ICD-10-CM | POA: Diagnosis not present

## 2023-03-21 DIAGNOSIS — R41841 Cognitive communication deficit: Secondary | ICD-10-CM | POA: Diagnosis not present

## 2023-03-21 DIAGNOSIS — G309 Alzheimer's disease, unspecified: Secondary | ICD-10-CM | POA: Diagnosis not present

## 2023-03-21 DIAGNOSIS — R1310 Dysphagia, unspecified: Secondary | ICD-10-CM | POA: Diagnosis not present

## 2023-03-22 DIAGNOSIS — G309 Alzheimer's disease, unspecified: Secondary | ICD-10-CM | POA: Diagnosis not present

## 2023-03-22 DIAGNOSIS — R1310 Dysphagia, unspecified: Secondary | ICD-10-CM | POA: Diagnosis not present

## 2023-03-22 DIAGNOSIS — R41841 Cognitive communication deficit: Secondary | ICD-10-CM | POA: Diagnosis not present

## 2023-03-23 DIAGNOSIS — R41841 Cognitive communication deficit: Secondary | ICD-10-CM | POA: Diagnosis not present

## 2023-03-23 DIAGNOSIS — G309 Alzheimer's disease, unspecified: Secondary | ICD-10-CM | POA: Diagnosis not present

## 2023-03-23 DIAGNOSIS — R1310 Dysphagia, unspecified: Secondary | ICD-10-CM | POA: Diagnosis not present

## 2023-03-26 DIAGNOSIS — R41841 Cognitive communication deficit: Secondary | ICD-10-CM | POA: Diagnosis not present

## 2023-03-26 DIAGNOSIS — G309 Alzheimer's disease, unspecified: Secondary | ICD-10-CM | POA: Diagnosis not present

## 2023-03-26 DIAGNOSIS — R1310 Dysphagia, unspecified: Secondary | ICD-10-CM | POA: Diagnosis not present

## 2023-03-27 DIAGNOSIS — R41841 Cognitive communication deficit: Secondary | ICD-10-CM | POA: Diagnosis not present

## 2023-03-27 DIAGNOSIS — R1310 Dysphagia, unspecified: Secondary | ICD-10-CM | POA: Diagnosis not present

## 2023-03-27 DIAGNOSIS — G309 Alzheimer's disease, unspecified: Secondary | ICD-10-CM | POA: Diagnosis not present

## 2023-03-28 DIAGNOSIS — G309 Alzheimer's disease, unspecified: Secondary | ICD-10-CM | POA: Diagnosis not present

## 2023-03-28 DIAGNOSIS — R41841 Cognitive communication deficit: Secondary | ICD-10-CM | POA: Diagnosis not present

## 2023-03-28 DIAGNOSIS — R1310 Dysphagia, unspecified: Secondary | ICD-10-CM | POA: Diagnosis not present

## 2023-03-29 DIAGNOSIS — G309 Alzheimer's disease, unspecified: Secondary | ICD-10-CM | POA: Diagnosis not present

## 2023-03-29 DIAGNOSIS — R1310 Dysphagia, unspecified: Secondary | ICD-10-CM | POA: Diagnosis not present

## 2023-03-29 DIAGNOSIS — R41841 Cognitive communication deficit: Secondary | ICD-10-CM | POA: Diagnosis not present

## 2023-04-10 DIAGNOSIS — I7091 Generalized atherosclerosis: Secondary | ICD-10-CM | POA: Diagnosis not present

## 2023-04-10 DIAGNOSIS — B351 Tinea unguium: Secondary | ICD-10-CM | POA: Diagnosis not present

## 2023-04-12 DIAGNOSIS — R1310 Dysphagia, unspecified: Secondary | ICD-10-CM | POA: Diagnosis not present

## 2023-04-12 DIAGNOSIS — G309 Alzheimer's disease, unspecified: Secondary | ICD-10-CM | POA: Diagnosis not present

## 2023-04-12 DIAGNOSIS — R41841 Cognitive communication deficit: Secondary | ICD-10-CM | POA: Diagnosis not present

## 2023-05-10 DIAGNOSIS — K5900 Constipation, unspecified: Secondary | ICD-10-CM | POA: Diagnosis not present

## 2023-05-10 DIAGNOSIS — E876 Hypokalemia: Secondary | ICD-10-CM | POA: Diagnosis not present

## 2023-05-10 DIAGNOSIS — G309 Alzheimer's disease, unspecified: Secondary | ICD-10-CM | POA: Diagnosis not present

## 2023-05-10 DIAGNOSIS — F02818 Dementia in other diseases classified elsewhere, unspecified severity, with other behavioral disturbance: Secondary | ICD-10-CM | POA: Diagnosis not present

## 2023-05-10 DIAGNOSIS — E039 Hypothyroidism, unspecified: Secondary | ICD-10-CM | POA: Diagnosis not present

## 2023-05-10 DIAGNOSIS — F329 Major depressive disorder, single episode, unspecified: Secondary | ICD-10-CM | POA: Diagnosis not present

## 2023-05-18 DIAGNOSIS — F0283 Dementia in other diseases classified elsewhere, unspecified severity, with mood disturbance: Secondary | ICD-10-CM | POA: Diagnosis not present

## 2023-05-18 DIAGNOSIS — E876 Hypokalemia: Secondary | ICD-10-CM | POA: Diagnosis not present

## 2023-05-18 DIAGNOSIS — E039 Hypothyroidism, unspecified: Secondary | ICD-10-CM | POA: Diagnosis not present

## 2023-06-19 DIAGNOSIS — G309 Alzheimer's disease, unspecified: Secondary | ICD-10-CM | POA: Diagnosis not present

## 2023-06-19 DIAGNOSIS — Z741 Need for assistance with personal care: Secondary | ICD-10-CM | POA: Diagnosis not present

## 2023-06-19 DIAGNOSIS — M6281 Muscle weakness (generalized): Secondary | ICD-10-CM | POA: Diagnosis not present

## 2023-06-19 DIAGNOSIS — Z993 Dependence on wheelchair: Secondary | ICD-10-CM | POA: Diagnosis not present

## 2023-07-02 DIAGNOSIS — E039 Hypothyroidism, unspecified: Secondary | ICD-10-CM | POA: Diagnosis not present

## 2023-07-09 DIAGNOSIS — E039 Hypothyroidism, unspecified: Secondary | ICD-10-CM | POA: Diagnosis not present

## 2023-07-09 DIAGNOSIS — F329 Major depressive disorder, single episode, unspecified: Secondary | ICD-10-CM | POA: Diagnosis not present

## 2023-07-09 DIAGNOSIS — K5900 Constipation, unspecified: Secondary | ICD-10-CM | POA: Diagnosis not present

## 2023-07-09 DIAGNOSIS — E876 Hypokalemia: Secondary | ICD-10-CM | POA: Diagnosis not present

## 2023-07-09 DIAGNOSIS — G309 Alzheimer's disease, unspecified: Secondary | ICD-10-CM | POA: Diagnosis not present

## 2023-07-29 DIAGNOSIS — B351 Tinea unguium: Secondary | ICD-10-CM | POA: Diagnosis not present

## 2023-07-29 DIAGNOSIS — I7091 Generalized atherosclerosis: Secondary | ICD-10-CM | POA: Diagnosis not present

## 2023-08-02 DIAGNOSIS — Z741 Need for assistance with personal care: Secondary | ICD-10-CM | POA: Diagnosis not present

## 2023-08-02 DIAGNOSIS — Z993 Dependence on wheelchair: Secondary | ICD-10-CM | POA: Diagnosis not present

## 2023-08-02 DIAGNOSIS — G309 Alzheimer's disease, unspecified: Secondary | ICD-10-CM | POA: Diagnosis not present

## 2023-08-03 DIAGNOSIS — Z741 Need for assistance with personal care: Secondary | ICD-10-CM | POA: Diagnosis not present

## 2023-08-03 DIAGNOSIS — Z993 Dependence on wheelchair: Secondary | ICD-10-CM | POA: Diagnosis not present

## 2023-08-03 DIAGNOSIS — G309 Alzheimer's disease, unspecified: Secondary | ICD-10-CM | POA: Diagnosis not present

## 2023-08-08 DIAGNOSIS — Z741 Need for assistance with personal care: Secondary | ICD-10-CM | POA: Diagnosis not present

## 2023-08-08 DIAGNOSIS — G309 Alzheimer's disease, unspecified: Secondary | ICD-10-CM | POA: Diagnosis not present

## 2023-08-08 DIAGNOSIS — Z993 Dependence on wheelchair: Secondary | ICD-10-CM | POA: Diagnosis not present

## 2023-08-11 DIAGNOSIS — Z741 Need for assistance with personal care: Secondary | ICD-10-CM | POA: Diagnosis not present

## 2023-08-11 DIAGNOSIS — G309 Alzheimer's disease, unspecified: Secondary | ICD-10-CM | POA: Diagnosis not present

## 2023-08-11 DIAGNOSIS — Z993 Dependence on wheelchair: Secondary | ICD-10-CM | POA: Diagnosis not present

## 2023-08-14 DIAGNOSIS — Z993 Dependence on wheelchair: Secondary | ICD-10-CM | POA: Diagnosis not present

## 2023-08-14 DIAGNOSIS — Z741 Need for assistance with personal care: Secondary | ICD-10-CM | POA: Diagnosis not present

## 2023-08-14 DIAGNOSIS — G309 Alzheimer's disease, unspecified: Secondary | ICD-10-CM | POA: Diagnosis not present

## 2023-08-15 DIAGNOSIS — Z741 Need for assistance with personal care: Secondary | ICD-10-CM | POA: Diagnosis not present

## 2023-08-15 DIAGNOSIS — Z993 Dependence on wheelchair: Secondary | ICD-10-CM | POA: Diagnosis not present

## 2023-08-15 DIAGNOSIS — G309 Alzheimer's disease, unspecified: Secondary | ICD-10-CM | POA: Diagnosis not present

## 2023-08-16 DIAGNOSIS — G309 Alzheimer's disease, unspecified: Secondary | ICD-10-CM | POA: Diagnosis not present

## 2023-08-16 DIAGNOSIS — Z741 Need for assistance with personal care: Secondary | ICD-10-CM | POA: Diagnosis not present

## 2023-08-16 DIAGNOSIS — Z993 Dependence on wheelchair: Secondary | ICD-10-CM | POA: Diagnosis not present

## 2023-08-17 DIAGNOSIS — Z741 Need for assistance with personal care: Secondary | ICD-10-CM | POA: Diagnosis not present

## 2023-08-17 DIAGNOSIS — Z993 Dependence on wheelchair: Secondary | ICD-10-CM | POA: Diagnosis not present

## 2023-08-17 DIAGNOSIS — G309 Alzheimer's disease, unspecified: Secondary | ICD-10-CM | POA: Diagnosis not present

## 2023-08-20 DIAGNOSIS — E039 Hypothyroidism, unspecified: Secondary | ICD-10-CM | POA: Diagnosis not present

## 2023-08-20 DIAGNOSIS — F0283 Dementia in other diseases classified elsewhere, unspecified severity, with mood disturbance: Secondary | ICD-10-CM | POA: Diagnosis not present

## 2023-08-20 DIAGNOSIS — E876 Hypokalemia: Secondary | ICD-10-CM | POA: Diagnosis not present

## 2023-08-21 DIAGNOSIS — Z741 Need for assistance with personal care: Secondary | ICD-10-CM | POA: Diagnosis not present

## 2023-08-21 DIAGNOSIS — G309 Alzheimer's disease, unspecified: Secondary | ICD-10-CM | POA: Diagnosis not present

## 2023-08-21 DIAGNOSIS — Z993 Dependence on wheelchair: Secondary | ICD-10-CM | POA: Diagnosis not present

## 2023-08-27 DIAGNOSIS — Z741 Need for assistance with personal care: Secondary | ICD-10-CM | POA: Diagnosis not present

## 2023-08-27 DIAGNOSIS — Z993 Dependence on wheelchair: Secondary | ICD-10-CM | POA: Diagnosis not present

## 2023-08-27 DIAGNOSIS — G309 Alzheimer's disease, unspecified: Secondary | ICD-10-CM | POA: Diagnosis not present

## 2023-08-28 DIAGNOSIS — Z741 Need for assistance with personal care: Secondary | ICD-10-CM | POA: Diagnosis not present

## 2023-08-28 DIAGNOSIS — G309 Alzheimer's disease, unspecified: Secondary | ICD-10-CM | POA: Diagnosis not present

## 2023-08-28 DIAGNOSIS — Z993 Dependence on wheelchair: Secondary | ICD-10-CM | POA: Diagnosis not present

## 2023-08-29 DIAGNOSIS — G309 Alzheimer's disease, unspecified: Secondary | ICD-10-CM | POA: Diagnosis not present

## 2023-08-29 DIAGNOSIS — Z993 Dependence on wheelchair: Secondary | ICD-10-CM | POA: Diagnosis not present

## 2023-08-29 DIAGNOSIS — Z741 Need for assistance with personal care: Secondary | ICD-10-CM | POA: Diagnosis not present

## 2023-08-30 DIAGNOSIS — Z741 Need for assistance with personal care: Secondary | ICD-10-CM | POA: Diagnosis not present

## 2023-08-30 DIAGNOSIS — Z993 Dependence on wheelchair: Secondary | ICD-10-CM | POA: Diagnosis not present

## 2023-08-30 DIAGNOSIS — G309 Alzheimer's disease, unspecified: Secondary | ICD-10-CM | POA: Diagnosis not present

## 2023-08-31 DIAGNOSIS — Z741 Need for assistance with personal care: Secondary | ICD-10-CM | POA: Diagnosis not present

## 2023-08-31 DIAGNOSIS — Z993 Dependence on wheelchair: Secondary | ICD-10-CM | POA: Diagnosis not present

## 2023-08-31 DIAGNOSIS — G309 Alzheimer's disease, unspecified: Secondary | ICD-10-CM | POA: Diagnosis not present

## 2023-09-05 DIAGNOSIS — G309 Alzheimer's disease, unspecified: Secondary | ICD-10-CM | POA: Diagnosis not present

## 2023-09-05 DIAGNOSIS — E876 Hypokalemia: Secondary | ICD-10-CM | POA: Diagnosis not present

## 2023-09-05 DIAGNOSIS — E039 Hypothyroidism, unspecified: Secondary | ICD-10-CM | POA: Diagnosis not present

## 2023-09-05 DIAGNOSIS — F329 Major depressive disorder, single episode, unspecified: Secondary | ICD-10-CM | POA: Diagnosis not present

## 2023-09-05 DIAGNOSIS — K5900 Constipation, unspecified: Secondary | ICD-10-CM | POA: Diagnosis not present

## 2023-10-02 DIAGNOSIS — F02B3 Dementia in other diseases classified elsewhere, moderate, with mood disturbance: Secondary | ICD-10-CM | POA: Diagnosis not present

## 2023-10-02 DIAGNOSIS — G301 Alzheimer's disease with late onset: Secondary | ICD-10-CM | POA: Diagnosis not present

## 2023-10-09 DIAGNOSIS — R293 Abnormal posture: Secondary | ICD-10-CM | POA: Diagnosis not present

## 2023-10-09 DIAGNOSIS — G309 Alzheimer's disease, unspecified: Secondary | ICD-10-CM | POA: Diagnosis not present

## 2023-10-09 DIAGNOSIS — M6281 Muscle weakness (generalized): Secondary | ICD-10-CM | POA: Diagnosis not present

## 2023-10-11 DIAGNOSIS — G309 Alzheimer's disease, unspecified: Secondary | ICD-10-CM | POA: Diagnosis not present

## 2023-10-11 DIAGNOSIS — M6281 Muscle weakness (generalized): Secondary | ICD-10-CM | POA: Diagnosis not present

## 2023-10-11 DIAGNOSIS — R293 Abnormal posture: Secondary | ICD-10-CM | POA: Diagnosis not present

## 2023-10-12 DIAGNOSIS — M6281 Muscle weakness (generalized): Secondary | ICD-10-CM | POA: Diagnosis not present

## 2023-10-12 DIAGNOSIS — R293 Abnormal posture: Secondary | ICD-10-CM | POA: Diagnosis not present

## 2023-10-12 DIAGNOSIS — G309 Alzheimer's disease, unspecified: Secondary | ICD-10-CM | POA: Diagnosis not present

## 2023-10-15 DIAGNOSIS — M6281 Muscle weakness (generalized): Secondary | ICD-10-CM | POA: Diagnosis not present

## 2023-10-15 DIAGNOSIS — G309 Alzheimer's disease, unspecified: Secondary | ICD-10-CM | POA: Diagnosis not present

## 2023-10-15 DIAGNOSIS — R293 Abnormal posture: Secondary | ICD-10-CM | POA: Diagnosis not present

## 2023-10-16 DIAGNOSIS — G309 Alzheimer's disease, unspecified: Secondary | ICD-10-CM | POA: Diagnosis not present

## 2023-10-16 DIAGNOSIS — R293 Abnormal posture: Secondary | ICD-10-CM | POA: Diagnosis not present

## 2023-10-16 DIAGNOSIS — M6281 Muscle weakness (generalized): Secondary | ICD-10-CM | POA: Diagnosis not present

## 2023-10-17 DIAGNOSIS — G309 Alzheimer's disease, unspecified: Secondary | ICD-10-CM | POA: Diagnosis not present

## 2023-10-17 DIAGNOSIS — M6281 Muscle weakness (generalized): Secondary | ICD-10-CM | POA: Diagnosis not present

## 2023-10-17 DIAGNOSIS — R293 Abnormal posture: Secondary | ICD-10-CM | POA: Diagnosis not present

## 2023-10-18 DIAGNOSIS — M6281 Muscle weakness (generalized): Secondary | ICD-10-CM | POA: Diagnosis not present

## 2023-10-18 DIAGNOSIS — G309 Alzheimer's disease, unspecified: Secondary | ICD-10-CM | POA: Diagnosis not present

## 2023-10-18 DIAGNOSIS — R293 Abnormal posture: Secondary | ICD-10-CM | POA: Diagnosis not present

## 2023-10-19 DIAGNOSIS — R293 Abnormal posture: Secondary | ICD-10-CM | POA: Diagnosis not present

## 2023-10-19 DIAGNOSIS — G309 Alzheimer's disease, unspecified: Secondary | ICD-10-CM | POA: Diagnosis not present

## 2023-10-19 DIAGNOSIS — M6281 Muscle weakness (generalized): Secondary | ICD-10-CM | POA: Diagnosis not present

## 2023-10-23 DIAGNOSIS — R293 Abnormal posture: Secondary | ICD-10-CM | POA: Diagnosis not present

## 2023-10-23 DIAGNOSIS — M6281 Muscle weakness (generalized): Secondary | ICD-10-CM | POA: Diagnosis not present

## 2023-10-23 DIAGNOSIS — G309 Alzheimer's disease, unspecified: Secondary | ICD-10-CM | POA: Diagnosis not present

## 2023-12-20 DIAGNOSIS — G309 Alzheimer's disease, unspecified: Secondary | ICD-10-CM | POA: Diagnosis not present

## 2023-12-20 DIAGNOSIS — R1311 Dysphagia, oral phase: Secondary | ICD-10-CM | POA: Diagnosis not present

## 2023-12-20 DIAGNOSIS — Z993 Dependence on wheelchair: Secondary | ICD-10-CM | POA: Diagnosis not present

## 2023-12-24 DIAGNOSIS — R1311 Dysphagia, oral phase: Secondary | ICD-10-CM | POA: Diagnosis not present

## 2023-12-24 DIAGNOSIS — G309 Alzheimer's disease, unspecified: Secondary | ICD-10-CM | POA: Diagnosis not present

## 2023-12-24 DIAGNOSIS — Z993 Dependence on wheelchair: Secondary | ICD-10-CM | POA: Diagnosis not present

## 2023-12-26 DIAGNOSIS — G309 Alzheimer's disease, unspecified: Secondary | ICD-10-CM | POA: Diagnosis not present

## 2023-12-26 DIAGNOSIS — R1311 Dysphagia, oral phase: Secondary | ICD-10-CM | POA: Diagnosis not present

## 2023-12-26 DIAGNOSIS — Z993 Dependence on wheelchair: Secondary | ICD-10-CM | POA: Diagnosis not present

## 2023-12-28 DIAGNOSIS — Z993 Dependence on wheelchair: Secondary | ICD-10-CM | POA: Diagnosis not present

## 2023-12-28 DIAGNOSIS — G309 Alzheimer's disease, unspecified: Secondary | ICD-10-CM | POA: Diagnosis not present

## 2023-12-28 DIAGNOSIS — R1311 Dysphagia, oral phase: Secondary | ICD-10-CM | POA: Diagnosis not present

## 2024-01-01 DIAGNOSIS — Z993 Dependence on wheelchair: Secondary | ICD-10-CM | POA: Diagnosis not present

## 2024-01-01 DIAGNOSIS — G309 Alzheimer's disease, unspecified: Secondary | ICD-10-CM | POA: Diagnosis not present

## 2024-01-01 DIAGNOSIS — R1311 Dysphagia, oral phase: Secondary | ICD-10-CM | POA: Diagnosis not present

## 2024-01-02 DIAGNOSIS — R1311 Dysphagia, oral phase: Secondary | ICD-10-CM | POA: Diagnosis not present

## 2024-01-02 DIAGNOSIS — Z993 Dependence on wheelchair: Secondary | ICD-10-CM | POA: Diagnosis not present

## 2024-01-02 DIAGNOSIS — G309 Alzheimer's disease, unspecified: Secondary | ICD-10-CM | POA: Diagnosis not present

## 2024-01-03 DIAGNOSIS — G309 Alzheimer's disease, unspecified: Secondary | ICD-10-CM | POA: Diagnosis not present

## 2024-01-03 DIAGNOSIS — R1311 Dysphagia, oral phase: Secondary | ICD-10-CM | POA: Diagnosis not present

## 2024-01-03 DIAGNOSIS — Z993 Dependence on wheelchair: Secondary | ICD-10-CM | POA: Diagnosis not present

## 2024-01-04 DIAGNOSIS — G309 Alzheimer's disease, unspecified: Secondary | ICD-10-CM | POA: Diagnosis not present

## 2024-01-04 DIAGNOSIS — Z993 Dependence on wheelchair: Secondary | ICD-10-CM | POA: Diagnosis not present

## 2024-01-04 DIAGNOSIS — R1311 Dysphagia, oral phase: Secondary | ICD-10-CM | POA: Diagnosis not present

## 2024-01-07 DIAGNOSIS — G309 Alzheimer's disease, unspecified: Secondary | ICD-10-CM | POA: Diagnosis not present

## 2024-01-07 DIAGNOSIS — R1311 Dysphagia, oral phase: Secondary | ICD-10-CM | POA: Diagnosis not present

## 2024-01-07 DIAGNOSIS — Z993 Dependence on wheelchair: Secondary | ICD-10-CM | POA: Diagnosis not present

## 2024-01-09 DIAGNOSIS — G309 Alzheimer's disease, unspecified: Secondary | ICD-10-CM | POA: Diagnosis not present

## 2024-01-09 DIAGNOSIS — Z993 Dependence on wheelchair: Secondary | ICD-10-CM | POA: Diagnosis not present

## 2024-01-10 DIAGNOSIS — Z993 Dependence on wheelchair: Secondary | ICD-10-CM | POA: Diagnosis not present

## 2024-01-10 DIAGNOSIS — G309 Alzheimer's disease, unspecified: Secondary | ICD-10-CM | POA: Diagnosis not present

## 2024-03-18 DIAGNOSIS — R293 Abnormal posture: Secondary | ICD-10-CM | POA: Diagnosis not present

## 2024-03-18 DIAGNOSIS — G309 Alzheimer's disease, unspecified: Secondary | ICD-10-CM | POA: Diagnosis not present

## 2024-03-19 DIAGNOSIS — R293 Abnormal posture: Secondary | ICD-10-CM | POA: Diagnosis not present

## 2024-03-19 DIAGNOSIS — G309 Alzheimer's disease, unspecified: Secondary | ICD-10-CM | POA: Diagnosis not present

## 2024-03-20 DIAGNOSIS — R293 Abnormal posture: Secondary | ICD-10-CM | POA: Diagnosis not present

## 2024-03-20 DIAGNOSIS — G309 Alzheimer's disease, unspecified: Secondary | ICD-10-CM | POA: Diagnosis not present

## 2024-03-21 DIAGNOSIS — G309 Alzheimer's disease, unspecified: Secondary | ICD-10-CM | POA: Diagnosis not present

## 2024-03-21 DIAGNOSIS — R293 Abnormal posture: Secondary | ICD-10-CM | POA: Diagnosis not present

## 2024-03-24 DIAGNOSIS — R293 Abnormal posture: Secondary | ICD-10-CM | POA: Diagnosis not present

## 2024-03-24 DIAGNOSIS — G309 Alzheimer's disease, unspecified: Secondary | ICD-10-CM | POA: Diagnosis not present

## 2024-03-25 DIAGNOSIS — R293 Abnormal posture: Secondary | ICD-10-CM | POA: Diagnosis not present

## 2024-03-25 DIAGNOSIS — G309 Alzheimer's disease, unspecified: Secondary | ICD-10-CM | POA: Diagnosis not present

## 2024-03-26 DIAGNOSIS — R293 Abnormal posture: Secondary | ICD-10-CM | POA: Diagnosis not present

## 2024-03-26 DIAGNOSIS — G309 Alzheimer's disease, unspecified: Secondary | ICD-10-CM | POA: Diagnosis not present

## 2024-03-27 DIAGNOSIS — G309 Alzheimer's disease, unspecified: Secondary | ICD-10-CM | POA: Diagnosis not present

## 2024-03-27 DIAGNOSIS — R293 Abnormal posture: Secondary | ICD-10-CM | POA: Diagnosis not present

## 2024-03-31 DIAGNOSIS — R293 Abnormal posture: Secondary | ICD-10-CM | POA: Diagnosis not present

## 2024-03-31 DIAGNOSIS — G309 Alzheimer's disease, unspecified: Secondary | ICD-10-CM | POA: Diagnosis not present

## 2024-04-01 DIAGNOSIS — R293 Abnormal posture: Secondary | ICD-10-CM | POA: Diagnosis not present

## 2024-04-01 DIAGNOSIS — G309 Alzheimer's disease, unspecified: Secondary | ICD-10-CM | POA: Diagnosis not present
